# Patient Record
Sex: Female | Born: 1980 | Race: Black or African American | Hispanic: No | Marital: Single | Smoking: Current some day smoker
Health system: Southern US, Community
[De-identification: ages and names within clinical notes are randomized; demographics above are authoritative.]

## PROBLEM LIST (undated history)

## (undated) ENCOUNTER — Inpatient Hospital Stay (HOSPITAL_COMMUNITY): Payer: Self-pay

## (undated) DIAGNOSIS — F329 Major depressive disorder, single episode, unspecified: Secondary | ICD-10-CM

## (undated) DIAGNOSIS — IMO0002 Reserved for concepts with insufficient information to code with codable children: Secondary | ICD-10-CM

## (undated) DIAGNOSIS — R87619 Unspecified abnormal cytological findings in specimens from cervix uteri: Secondary | ICD-10-CM

## (undated) DIAGNOSIS — N390 Urinary tract infection, site not specified: Secondary | ICD-10-CM

## (undated) DIAGNOSIS — A749 Chlamydial infection, unspecified: Secondary | ICD-10-CM

## (undated) DIAGNOSIS — A879 Viral meningitis, unspecified: Secondary | ICD-10-CM

## (undated) DIAGNOSIS — F32A Depression, unspecified: Secondary | ICD-10-CM

## (undated) DIAGNOSIS — I10 Essential (primary) hypertension: Secondary | ICD-10-CM

## (undated) DIAGNOSIS — L509 Urticaria, unspecified: Secondary | ICD-10-CM

## (undated) DIAGNOSIS — O139 Gestational [pregnancy-induced] hypertension without significant proteinuria, unspecified trimester: Secondary | ICD-10-CM

## (undated) DIAGNOSIS — B999 Unspecified infectious disease: Secondary | ICD-10-CM

## (undated) DIAGNOSIS — R51 Headache: Secondary | ICD-10-CM

## (undated) HISTORY — PX: OTHER SURGICAL HISTORY: SHX169

## (undated) HISTORY — DX: Urticaria, unspecified: L50.9

---

## 2002-12-02 ENCOUNTER — Emergency Department (HOSPITAL_COMMUNITY): Admission: EM | Admit: 2002-12-02 | Discharge: 2002-12-02 | Payer: Self-pay | Admitting: Emergency Medicine

## 2004-09-06 DIAGNOSIS — IMO0002 Reserved for concepts with insufficient information to code with codable children: Secondary | ICD-10-CM

## 2004-09-06 HISTORY — DX: Reserved for concepts with insufficient information to code with codable children: IMO0002

## 2011-06-04 ENCOUNTER — Encounter (HOSPITAL_COMMUNITY): Payer: Self-pay | Admitting: *Deleted

## 2011-06-04 ENCOUNTER — Inpatient Hospital Stay (HOSPITAL_COMMUNITY): Payer: Medicaid Other

## 2011-06-04 ENCOUNTER — Inpatient Hospital Stay (HOSPITAL_COMMUNITY)
Admission: AD | Admit: 2011-06-04 | Discharge: 2011-06-04 | Disposition: A | Discharge status: Home / Self Care | Payer: Medicaid Other | Source: Ambulatory Visit | Attending: Obstetrics & Gynecology | Admitting: Obstetrics & Gynecology

## 2011-06-04 DIAGNOSIS — O021 Missed abortion: Secondary | ICD-10-CM | POA: Insufficient documentation

## 2011-06-04 HISTORY — DX: Major depressive disorder, single episode, unspecified: F32.9

## 2011-06-04 HISTORY — DX: Depression, unspecified: F32.A

## 2011-06-04 HISTORY — DX: Chlamydial infection, unspecified: A74.9

## 2011-06-04 LAB — WET PREP, GENITAL: Yeast Wet Prep HPF POC: NONE SEEN

## 2011-06-04 LAB — URINALYSIS, ROUTINE W REFLEX MICROSCOPIC
Bilirubin Urine: NEGATIVE
Glucose, UA: NEGATIVE mg/dL
Ketones, ur: NEGATIVE mg/dL
Protein, ur: NEGATIVE mg/dL

## 2011-06-04 LAB — CBC
MCV: 89 fL (ref 78.0–100.0)
Platelets: 258 10*3/uL (ref 150–400)
RDW: 12.9 % (ref 11.5–15.5)
WBC: 6.9 10*3/uL (ref 4.0–10.5)

## 2011-06-04 LAB — HCG, QUANTITATIVE, PREGNANCY: hCG, Beta Chain, Quant, S: 25428 m[IU]/mL — ABNORMAL HIGH (ref <5)

## 2011-06-04 LAB — URINE MICROSCOPIC-ADD ON

## 2011-06-04 NOTE — Progress Notes (Signed)
Did home test last THurs.  Started bleeding 2 hrs ago, has had yellowish d/c past 2 days- prior to bleeding had turned a dk reddish orange.

## 2011-06-04 NOTE — ED Provider Notes (Signed)
History   Chief Complaint:  Vaginal Bleeding   Karina [name] is  30 y.o. [id number] Patient's last menstrual period was 04/10/2011.Marland Kitchen  Her pregnancy status is positive.  She is [redacted]w[redacted]d by LMP.  She presents complaining of Vaginal Bleeding . Onset is described as insidious and has been present for  2 days.   OB History    Grav Para Term Preterm Abortions TAB SAB Ect Mult Living   14 6 5 1 7 4 3  0 0 5       Past Medical History  Diagnosis Date  . Preterm labor   . Depression   . Chlamydia     Past Surgical History  Procedure Date  . Elective abortion     No family history on file.  History  Substance Use Topics  . Smoking status: Never Smoker   . Smokeless tobacco: Never Used  . Alcohol Use: No    Allergies: No Known Allergies  No prescriptions prior to admission    Review of Systems - Negative except vag spotting and abd cramping  Physical Exam   Blood pressure 127/86, pulse 62, temperature 98.6 F (37 C), temperature source Oral, resp. rate 18, height 5\' 7"  (1.702 m), weight 176 lb 3.2 oz (79.924 kg), last menstrual period 04/10/2011.  General: General appearance - alert, well appearing, and in no distress, oriented to person, place, and time and overweight Mental status - alert, oriented to person, place, and time, normal mood, behavior, speech, dress, motor activity, and thought processes Abdomen - soft, nontender, nondistended, no masses or organomegaly Back exam - full range of motion, no tenderness, palpable spasm or pain on motion Neurological - alert, oriented, normal speech, no focal findings or movement disorder noted Musculoskeletal - no joint tenderness, deformity or swelling Focused Gynecological Exam: normal external genitalia, vulva, vagina, cervix, uterus and adnexa, VULVA: normal appearing vulva with no masses, tenderness or lesions, VAGINA: vaginal discharge - pink/brown in color, CERVIX: normal appearing cervix without discharge or lesions,  UTERUS:slightly enlarge, 6 weeks size, < dates, ADNEXA: normal adnexa in size, nontender and no masses  Labs: Recent Results (from the past 24 hour(s))  POCT PREGNANCY, URINE   Collection Time   06/04/11  1:47 PM      Component Value Range   Preg Test, Ur POSITIVE    URINALYSIS, ROUTINE W REFLEX MICROSCOPIC   Collection Time   06/04/11  1:55 PM      Component Value Range   Color, Urine STRAW (*) YELLOW    Appearance CLEAR  CLEAR    Specific Gravity, Urine 1.010  1.005 - 1.030    pH 7.0  5.0 - 8.0    Glucose, UA NEGATIVE  NEGATIVE (mg/dL)   Hgb urine dipstick MODERATE (*) NEGATIVE    Bilirubin Urine NEGATIVE  NEGATIVE    Ketones, ur NEGATIVE  NEGATIVE (mg/dL)   Protein, ur NEGATIVE  NEGATIVE (mg/dL)   Urobilinogen, UA 0.2  0.0 - 1.0 (mg/dL)   Nitrite NEGATIVE  NEGATIVE    Leukocytes, UA NEGATIVE  NEGATIVE   URINE MICROSCOPIC-ADD ON   Collection Time   06/04/11  1:55 PM      Component Value Range   Squamous Epithelial / LPF FEW (*) RARE    Bacteria, UA RARE  RARE   WET PREP, GENITAL   Collection Time   06/04/11  2:09 PM      Component Value Range   Yeast, Wet Prep NONE SEEN  NONE SEEN    Trich, Wet  Prep NONE SEEN  NONE SEEN    Clue Cells, Wet Prep FEW (*) NONE SEEN    WBC, Wet Prep HPF POC FEW (*) NONE SEEN   HCG, QUANTITATIVE, PREGNANCY   Collection Time   06/04/11  2:20 PM      Component Value Range   hCG, Beta Chain, Sharene Butters, [place] [address] (*) <5 (mIU/mL)  CBC   Collection Time   06/04/11  2:20 PM      Component Value Range   WBC 6.9  4.0 - 10.5 (K/uL)   RBC 3.83 (*) 3.87 - 5.11 (MIL/uL)   Hemoglobin 11.5 (*) 12.0 - 15.0 (g/dL)   HCT 60.4 (*) 54.0 - 46.0 (%)   MCV 89.0  78.0 - 100.0 (fL)   MCH 30.0  26.0 - 34.0 (pg)   MCHC 33.7  30.0 - 36.0 (g/dL)   RDW 98.1  19.1 - 47.8 (%)   Platelets 258  150 - 400 (K/uL)  ABO/RH   Collection Time   06/04/11  2:20 PM      Component Value Range   ABO/RH(D) B POS      Ultrasound Studies: US Ob Comp Less 14 Wks  Irregluar  intrauterine gest sac but no yolk sac or definite fetal pole id. Echoes w/i the gest sac are of unknown significances, possibly resorbing hemorrhage, fetal pole if there has benn a fail IUP. Or chorionic bump. Rec FU [place] in 10 days. Christiana Pellant, MD   Assessment: Probable missed AB   Plan: FU 2 days for repeat quant, if falling, no need for repeat US  Discharge Medications: None  Consult: Discussed pt with Dr. Debroah Loop, agrees with plan    [facility] E. 06/04/2011, 7:10 PM

## 2011-06-05 LAB — GC/CHLAMYDIA PROBE AMP, GENITAL: GC Probe Amp, Genital: NEGATIVE

## 2011-06-06 ENCOUNTER — Encounter (HOSPITAL_COMMUNITY): Payer: Self-pay | Admitting: *Deleted

## 2011-06-06 ENCOUNTER — Inpatient Hospital Stay (HOSPITAL_COMMUNITY)
Admission: AD | Admit: 2011-06-06 | Discharge: 2011-06-06 | Disposition: A | Discharge status: Home / Self Care | Payer: Medicaid Other | Source: Ambulatory Visit | Attending: Obstetrics and Gynecology | Admitting: Obstetrics and Gynecology

## 2011-06-06 ENCOUNTER — Ambulatory Visit (HOSPITAL_COMMUNITY): Payer: Medicaid Other

## 2011-06-06 DIAGNOSIS — O200 Threatened abortion: Secondary | ICD-10-CM | POA: Insufficient documentation

## 2011-06-06 NOTE — Progress Notes (Signed)
Pt is a repeat BHCG- put in room 2 for MD to speak with her regarding plan of care.

## 2011-06-06 NOTE — Discharge Instructions (Signed)
Threatened Miscarriage Bleeding during the first 20 weeks of pregnancy is common. This is sometimes called a threatened miscarriage. This is a pregnancy that is threatening to end before the twentieth week of pregnancy. Often this bleeding stops with bed rest or decreased activities as suggested by your caregiver and the pregnancy continues without any more problems. You may be asked to not have sexual intercourse, have orgasms or use tampons until further notice. Sometimes a threatened miscarriage can progress to a complete or incomplete miscarriage. This may or may not require further treatment. Some miscarriages occur before a woman misses a menstrual period and knows she is pregnant. Miscarriages occur in 15 to 20% of all pregnancies and usually occur during the first 13 weeks of the pregnancy. The exact cause of a miscarriage is usually never known. A miscarriage is natures way of ending a pregnancy that is abnormal or would not make it to term. There are some things that may put you at risk to have a miscarriage, such as:  Hormone problems.   Infection of the uterus or cervix.   Chronic illness, diabetes for example, especially if it is not controlled.   Abnormal shaped uterus.   Fibroids in the uterus.   Incompetent cervix (the cervix is too weak to hold the baby).   Smoking.   Drinking too much alcohol. Its best not to drink any alcohol when you are pregnant.   Taking illegal drugs.  TREATMENT When a miscarriage becomes complete and all products of conception (all the tissue in the uterus) have been passed, often no treatment is needed. If you think you passed tissue, save it in a container and take it to your doctor for evaluation. If the miscarriage is incomplete (parts of the fetus or placenta remain in the uterus), further treatment may be needed. The most common reason for further treatment is continued bleeding (hemorrhage) because pregnancy tissue did not pass out of the uterus.  This often occurs if a miscarriage is incomplete. Tissue left behind may also become infected. Treatment usually is dilatation and curettage (the removal of the remaining products of pregnancy. This can be done by a simple sucking procedure (suction curettage) or a simple scraping of the inside of the uterus. This may be done in the hospital or in the caregiver's office. This is only done when your caregiver knows that there is no chance for the pregnancy to proceed to term. This is determined by physical examination, negative pregnancy test, falling pregnancy hormone count and/or, an ultrasound revealing a dead fetus. Miscarriages are often a very emotional time for prospective mothers and fathers. This is not you or your partners fault. It did not occur because of an inadequacy in you or your partner. Nearly all miscarriages occur because the pregnancy has started off wrongly. At least half of these pregnancies have a chromosomal abnormality. It is almost always not inherited. Others may have developmental problems with the fetus or placenta. This does not always show up even when the products miscarried are studied under the microscope. The miscarriage is nearly always not your fault and it is not likely that you could have prevented it from happening. If you are having emotional and grieving problems, talk to your health care provider and even seek counseling, if necessary, before getting pregnant again. You can begin trying for another pregnancy as soon as your caregiver says it is OK. HOME CARE INSTRUCTIONS  Your caregiver may order bed rest depending on how much bleeding and cramping  you are having. You may be limited to only getting up to go to the bathroom. You may be allowed to continue light activity. You may need to make arrangements for the care of your other children and for any other responsibilities.   Keep track of the number of pads you use each day, how often you have to change pads and how  saturated (soaked) they are. Record this information.   DO NOT USE TAMPONS. Do not douche, have sexual intercourse or orgasms until approved by your caregiver.   You may receive a follow up appointment for re-evaluation of your pregnancy and a repeat blood test. Re-evaluation often occurs after 2 days and again in 4 to 6 weeks. It is very important that you follow-up in the recommended time period.   If you are Rh negative and the father is Rh positive or you do not know the fathers blood type, you may receive a shot (Rh immune globulin) to help prevent abnormal antibodies that can develop and affect the baby in any future pregnancies.  SEEK IMMEDIATE MEDICAL ATTENTION IF:  You have severe cramps in your stomach, back, or abdomen.   You have a sudden onset of severe pain in the lower part of your abdomen.   You develop chills.   You run an unexplained temperature of 101 F (38.3 C) or higher.   You pass large clots or tissue. Save any tissue for your caregiver to inspect.   Your bleeding increases or you become light-headed, weak, or have fainting episodes.   You have a gush of fluid from your vagina.   You pass out. This could mean you have a tubal (ectopic) pregnancy.  Document Released: 10/29/2005 Document Re-Released: 08/07/2008 [facility] Patient Information 2011 [place], [place].

## 2011-06-06 NOTE — Progress Notes (Signed)
Pt to MAU for repeat BHCG. States she has been having R lower abdominal pain.

## 2011-06-06 NOTE — ED Provider Notes (Addendum)
Karina [name] is a 30 y.o. female who returns for follow up Bhcg. On her last visit 7/23 her Bhcg was 25,428 and  Her ultrasound showed an irregular IUGS but no YS. Today the Bhcg has dropped slightly to 24,922. The patient states that she is having only brown discharge and no pain, light headiness or other problems. I discussed the lab results with Dr. Jolayne Panther and she wants the patient to return next week for Bhcg follow up. If the patient has heavy bleeding, severe pain or other problems she will return sooner.  [place], [place] 06/16/11 1728

## 2011-06-13 ENCOUNTER — Inpatient Hospital Stay (HOSPITAL_COMMUNITY)
Admission: RE | Admit: 2011-06-13 | Discharge: 2011-06-13 | Disposition: A | Discharge status: Home / Self Care | Payer: Medicaid Other | Source: Ambulatory Visit | Attending: Obstetrics & Gynecology | Admitting: Obstetrics & Gynecology

## 2011-06-13 DIAGNOSIS — O021 Missed abortion: Secondary | ICD-10-CM | POA: Insufficient documentation

## 2011-06-13 MED ORDER — MISOPROSTOL 200 MCG PO TABS: ORAL_TABLET | ORAL | Status: DC

## 2011-06-13 MED ORDER — PROMETHAZINE HCL 25 MG PO TABS: 25.0000 mg | ORAL_TABLET | Freq: Four times a day (QID) | ORAL | Status: DC | PRN

## 2011-06-13 MED ORDER — HYDROCODONE-ACETAMINOPHEN 5-325 MG PO TABS: 2.0000 | ORAL_TABLET | ORAL | Status: AC | PRN

## 2011-06-13 NOTE — ED Provider Notes (Signed)
History   Chief Complaint:  Follow-up   Karina [name] is  30 y.o. [id number] Patient's last menstrual period was 04/10/2011.Marland Kitchen Patient is here for follow up of quantitative HCG and ongoing surveillance of pregnancy status.   She is Unknown weeks gestation  by LMP OF 04-10-11.  Her ultrasound on 06-04-11 showed an irreg gestational sac with no yolk sac and no fetal pole.  She has no pain today and only spotting.    Since her last visit, the patient is without new complaint.   The patient reports bleeding as  spotting and lighter than period.    General ROS:  negative  Her previous Quantitative HCG values are:  25,428 on 06-04-11 and 24,922 on 06-06-11.     Physical Exam   Blood pressure 128/78, pulse 82, temperature 98.8 F (37.1 C), temperature source Oral, resp. rate 16, last menstrual period 04/10/2011.  Focused Gynecological Exam: examination not indicated  Labs: Recent Results (from the past 24 hour(s))  HCG, QUANTITATIVE, PREGNANCY   Collection Time   06/13/11 12:30 PM      Component Value Range   hCG, Beta Chain, Quant, S [address] (*) <5 (mIU/mL)    Ultrasound Studies:   US Ob Comp Less 14 Wks  06/04/2011  OBSTETRICAL ULTRASOUND: This exam was performed within a [facility]. The OB US report was generated in the [facility] system, and faxed to the ordering physician.   This report is also available in [employer] and in the [employer]. See [facility] Obstetric US report.   Counseling re; nonviable pregnancy done.  Written info given.  Client desires cytotec.  Consultation with Dr. Macon Large who is agreement with this plan.  Assessment: Nonviable pregnancy, inevidable miscarriage.   Plan:  Cytotec, nausea and pain medication prescribed. The patient is instructed to follow up in in 2 weeks at GYN clinic.  BURLESON,TERRI 06/13/2011, 3:10 PM    Nolene Bernheim, NP 06/13/11 1517

## 2011-06-13 NOTE — Progress Notes (Signed)
Pt to MAU for  Repeat BHCG. Pt denies any pain or bleeding.

## 2011-06-13 NOTE — Plan of Care (Signed)
Patient given the Cytotec Facts to Know sheet. Consented to plan of care. Consent signed.

## 2011-06-13 NOTE — ED Provider Notes (Signed)
Agree with above note.  ANYANWU,UGONNA A 06/13/2011 4:50 PM

## 2011-06-23 ENCOUNTER — Inpatient Hospital Stay (HOSPITAL_COMMUNITY)
Admission: AD | Admit: 2011-06-23 | Discharge: 2011-06-23 | Disposition: A | Discharge status: Home / Self Care | Payer: Medicaid Other | Source: Ambulatory Visit | Attending: Obstetrics & Gynecology | Admitting: Obstetrics & Gynecology

## 2011-06-23 ENCOUNTER — Encounter (HOSPITAL_COMMUNITY): Payer: Self-pay | Admitting: Obstetrics and Gynecology

## 2011-06-23 ENCOUNTER — Inpatient Hospital Stay (HOSPITAL_COMMUNITY): Payer: Medicaid Other

## 2011-06-23 DIAGNOSIS — O039 Complete or unspecified spontaneous abortion without complication: Secondary | ICD-10-CM

## 2011-06-23 DIAGNOSIS — O200 Threatened abortion: Secondary | ICD-10-CM | POA: Insufficient documentation

## 2011-06-23 LAB — CBC
Hemoglobin: 11.8 g/dL — ABNORMAL LOW (ref 12.0–15.0)
MCH: 30.1 pg (ref 26.0–34.0)
MCV: 88.5 fL (ref 78.0–100.0)
Platelets: 315 10*3/uL (ref 150–400)
RBC: 3.92 MIL/uL (ref 3.87–5.11)

## 2011-06-23 NOTE — ED Provider Notes (Signed)
History     CSN: [id number] Arrival date & time: 06/23/2011 12:19 PM  Chief Complaint  Patient presents with  . Vaginal Bleeding  . Miscarriage   HPI Karina [name] is a 30 y.o. AA female who presents to MAU with heavy vaginal bleeding that started at 3am. She was evaluated here a few weeks ago for failed pregnancy. She was given Rx for cytotec but did not get it filled. She has an appointment with the GYN clinic on September 5th. Today in addition to the bleeding she is having lower abdominal cramping. The patient provided the history.    Past Medical History  Diagnosis Date  . Preterm labor   . Depression   . Chlamydia     Past Surgical History  Procedure Date  . Elective abortion     No family history on file.  History  Substance Use Topics  . Smoking status: Never Smoker   . Smokeless tobacco: Never Used  . Alcohol Use: No    OB History    Grav Para Term Preterm Abortions TAB SAB Ect Mult Living   16 6 5 1 7 4 3  0 0 5      Review of Systems  Respiratory: Negative.   Cardiovascular: Negative.   Gastrointestinal: Positive for abdominal pain. Negative for nausea, vomiting, diarrhea and constipation.  Genitourinary: Positive for vaginal bleeding and pelvic pain.  Musculoskeletal: Positive for back pain.  Skin: Negative.   Neurological: Negative for dizziness, light-headedness and headaches.    Physical Exam  LMP 04/10/2011  Physical Exam  Nursing note and vitals reviewed. Constitutional: She is oriented to person, place, and time. She appears well-developed and well-nourished.  Eyes: EOM are normal.  Neck: Neck supple.  Pulmonary/Chest: Effort normal.  Abdominal: Soft. There is no tenderness.  Genitourinary:       Moderate blood in vaginal vault. Cervix open with clot and ? Tissue. Removed with ring forceps.  Musculoskeletal: Normal range of motion.  Neurological: She is alert and oriented to person, place, and time. No cranial nerve deficit.  Skin:  Skin is warm and dry.   Results for orders placed during the hospital encounter of 06/23/11 (from the past 24 hour(s))  CBC     Status: Abnormal   Collection Time   06/23/11  1:19 PM      Component Value Range   WBC 6.6  4.0 - 10.5 (K/uL)   RBC 3.92  3.87 - 5.11 (MIL/uL)   Hemoglobin 11.8 (*) 12.0 - 15.0 (g/dL)   HCT 40.9 (*) 81.1 - 46.0 (%)   MCV 88.5  78.0 - 100.0 (fL)   MCH 30.1  26.0 - 34.0 (pg)   MCHC 34.0  30.0 - 36.0 (g/dL)   RDW 91.4  78.2 - 95.6 (%)   Platelets 315  150 - 400 (K/uL)  HCG, QUANTITATIVE, PREGNANCY     Status: Abnormal   Collection Time   06/23/11  1:19 PM      Component Value Range   hCG, Beta Chain, [place], S [address] (*) <5 (mIU/mL)    ED Course    Procedures  US OB Transvaginal   Status: Final result     Study Result     *RADIOLOGY REPORT*   Clinical Data: Spontaneous abortion on 06/04/2011, bleeding, evaluate for RPOC   TRANSVAGINAL OB ULTRASOUND   Technique:  Transvaginal ultrasound was performed for evaluation of the gestation as well as the maternal uterus and adnexal regions.   Comparison: 06/04/2011   Findings:  Heterogeneous appearance of the endometrial complex, measuring up to 12 mm, with fluid/debris in the endometrial cavity. No gestational sac or focal endometrial flow is seen.   Left ovary is within normal limits, measuring 1.7 x 2.5 x to 0.0 cm. Right ovary is within normal limits, measuring 1.6 x 2.0 x 2.8 cm.   No free fluid.   IMPRESSION: Heterogeneous appearance of the endometrial complex, measuring up to 12 mm, without focal vascularity.  Given the appearance and positive beta HCG, these findings are worrisome for nonvascular retained products of conception.   Original Report Authenticated By: Charline Bills, M.D.     MDM Bleeding has decreased and minimal cramping.   Consult with Dr. Macon Large, will d/c patient home and have her keep her appointment with GYN clinic 07/18/11  Assessment: SAB in progress  Plan:    Ibuprofen as needed and keep appointment in clinic            Return here as needed.      [place], [place] 06/23/11 [phone]

## 2011-06-23 NOTE — Progress Notes (Signed)
Pt presents to MAU with complaints of vaginal bleeding. Pt was seen here on 8/1 or follow up quant. Pt was given cytotec was unable to fill. Was told by MAU provider that spont. miscarriage would occur and to come back if bleeding and cramping worsen. Pt complains of vag bleeding, soaking 2 pads/ hr.

## 2011-06-27 NOTE — ED Provider Notes (Signed)
Agree with above note.  Karina [name] A 06/27/2011 9:18 AM

## 2011-07-18 ENCOUNTER — Emergency Department (HOSPITAL_COMMUNITY): Payer: Medicaid Other

## 2011-07-18 ENCOUNTER — Emergency Department (HOSPITAL_COMMUNITY)
Admission: EM | Admit: 2011-07-18 | Discharge: 2011-07-19 | Disposition: A | Discharge status: Home / Self Care | Payer: Medicaid Other | Attending: Emergency Medicine | Admitting: Emergency Medicine

## 2011-07-18 ENCOUNTER — Encounter: Payer: Medicaid Other | Admitting: Obstetrics and Gynecology

## 2011-07-18 DIAGNOSIS — R0602 Shortness of breath: Secondary | ICD-10-CM | POA: Insufficient documentation

## 2011-07-18 DIAGNOSIS — F410 Panic disorder [episodic paroxysmal anxiety] without agoraphobia: Secondary | ICD-10-CM | POA: Insufficient documentation

## 2011-07-18 DIAGNOSIS — F438 Other reactions to severe stress: Secondary | ICD-10-CM | POA: Insufficient documentation

## 2011-07-18 DIAGNOSIS — F4389 Other reactions to severe stress: Secondary | ICD-10-CM | POA: Insufficient documentation

## 2011-07-18 DIAGNOSIS — F17200 Nicotine dependence, unspecified, uncomplicated: Secondary | ICD-10-CM | POA: Insufficient documentation

## 2011-07-18 LAB — POCT I-STAT, CHEM 8
BUN: 11 mg/dL (ref 6–23)
Calcium, Ion: 1.14 mmol/L (ref 1.12–1.32)
Creatinine, Ser: 1.2 mg/dL — ABNORMAL HIGH (ref 0.50–1.10)
TCO2: 21 mmol/L (ref 0–100)

## 2011-09-13 NOTE — ED Provider Notes (Signed)
Agree with above note.  Indie Nickerson 09/13/2011 12:38 PM

## 2012-03-29 ENCOUNTER — Encounter (HOSPITAL_COMMUNITY): Payer: Self-pay

## 2012-03-29 ENCOUNTER — Inpatient Hospital Stay (HOSPITAL_COMMUNITY): Payer: Medicaid Other

## 2012-03-29 ENCOUNTER — Inpatient Hospital Stay (HOSPITAL_COMMUNITY)
Admission: AD | Admit: 2012-03-29 | Discharge: 2012-03-29 | Disposition: A | Discharge status: Home / Self Care | Payer: Medicaid Other | Source: Ambulatory Visit | Attending: Obstetrics & Gynecology | Admitting: Obstetrics & Gynecology

## 2012-03-29 DIAGNOSIS — O2650 Maternal hypotension syndrome, unspecified trimester: Secondary | ICD-10-CM | POA: Insufficient documentation

## 2012-03-29 DIAGNOSIS — N760 Acute vaginitis: Secondary | ICD-10-CM

## 2012-03-29 DIAGNOSIS — O99891 Other specified diseases and conditions complicating pregnancy: Secondary | ICD-10-CM | POA: Insufficient documentation

## 2012-03-29 DIAGNOSIS — O210 Mild hyperemesis gravidarum: Secondary | ICD-10-CM | POA: Insufficient documentation

## 2012-03-29 DIAGNOSIS — R1031 Right lower quadrant pain: Secondary | ICD-10-CM | POA: Insufficient documentation

## 2012-03-29 DIAGNOSIS — O219 Vomiting of pregnancy, unspecified: Secondary | ICD-10-CM

## 2012-03-29 LAB — CBC
HCT: 35.7 % — ABNORMAL LOW (ref 36.0–46.0)
Hemoglobin: 12.3 g/dL (ref 12.0–15.0)
MCHC: 34.5 g/dL (ref 30.0–36.0)
RBC: 4.13 MIL/uL (ref 3.87–5.11)

## 2012-03-29 LAB — URINALYSIS, ROUTINE W REFLEX MICROSCOPIC
Glucose, UA: NEGATIVE mg/dL
Hgb urine dipstick: NEGATIVE
Ketones, ur: 40 mg/dL — AB
Protein, ur: 30 mg/dL — AB

## 2012-03-29 LAB — WET PREP, GENITAL: Yeast Wet Prep HPF POC: NONE SEEN

## 2012-03-29 LAB — URINE MICROSCOPIC-ADD ON

## 2012-03-29 LAB — RAPID URINE DRUG SCREEN, HOSP PERFORMED
Barbiturates: NOT DETECTED
Cocaine: NOT DETECTED
Opiates: NOT DETECTED

## 2012-03-29 LAB — POCT PREGNANCY, URINE: Preg Test, Ur: POSITIVE — AB

## 2012-03-29 MED ORDER — METOCLOPRAMIDE HCL 5 MG/ML IJ SOLN
10.0000 mg | Freq: Once | INTRAMUSCULAR | Status: AC
  Administered 2012-03-29: 10 mg via INTRAVENOUS
  Filled 2012-03-29: qty 2

## 2012-03-29 MED ORDER — METRONIDAZOLE 500 MG PO TABS: 500.0000 mg | ORAL_TABLET | Freq: Two times a day (BID) | ORAL | Status: AC

## 2012-03-29 MED ORDER — DEXTROSE 5 % IN LACTATED RINGERS IV BOLUS
1000.0000 mL | Freq: Once | INTRAVENOUS | Status: AC
  Administered 2012-03-29: 1000 mL via INTRAVENOUS

## 2012-03-29 MED ORDER — ONDANSETRON HCL 4 MG/2ML IJ SOLN
4.0000 mg | Freq: Once | INTRAMUSCULAR | Status: AC
  Administered 2012-03-29: 4 mg via INTRAVENOUS
  Filled 2012-03-29: qty 2

## 2012-03-29 MED ORDER — PROMETHAZINE HCL 12.5 MG PO TABS: 12.5000 mg | ORAL_TABLET | Freq: Four times a day (QID) | ORAL | Status: DC | PRN

## 2012-03-29 MED ORDER — PRENATAL VITAMINS 28-0.8 MG PO TABS: 1.0000 | ORAL_TABLET | Freq: Every day | ORAL | Status: DC

## 2012-03-29 MED ORDER — ONDANSETRON HCL 4 MG PO TABS: 4.0000 mg | ORAL_TABLET | Freq: Three times a day (TID) | ORAL | Status: AC | PRN

## 2012-03-29 NOTE — ED Notes (Signed)
Pt found to be unresposive to sternal rub. Amonia ihalent and pt came to. V/S as listed in doc flow. Pt awake alert and oriented x4 now. Pt was hyperventilating put pt  Back on O2 and had pt take slow deep breath. Pt calmer now and talking. Attempting to start IV.

## 2012-03-29 NOTE — MAU Provider Note (Signed)
History     CSN: [id number]  Arrival date and time: 03/29/12 1755   First Provider Initiated Contact with Patient 03/29/12 1843      Chief Complaint  Patient presents with  . Emesis During Pregnancy  . Fatigue  . Abdominal Pain    right side    HPI Pt is [redacted]w[redacted]d pregnant and complains of RLQ pain and nausea and vomiting since yesterday.  Pt denies spotting or bleeding. Pt had syncopal episode upon arrival in MAU.  She reports the last time she ate was 2 days ago and she reports problems prior to pregnancy "feeling like I want to pass out" when she hasn't eaten.   Past Medical History  Diagnosis Date  . Preterm labor   . Depression   . Chlamydia     Past Surgical History  Procedure Date  . Elective abortion     History reviewed. No pertinent family history.  History  Substance Use Topics  . Smoking status: Never Smoker   . Smokeless tobacco: Never Used  . Alcohol Use: No    Allergies: No Known Allergies  No prescriptions prior to admission    Review of Systems  Constitutional: Negative for fever, chills and malaise/fatigue.  Eyes: Negative for blurred vision.  Respiratory: Negative for cough and shortness of breath.   Cardiovascular: Negative for chest pain.  Gastrointestinal: Positive for nausea, vomiting and abdominal pain. Negative for heartburn.  Genitourinary: Negative for dysuria, urgency and frequency.  Musculoskeletal: Negative.   Neurological: Negative for dizziness and headaches.  Psychiatric/Behavioral: Negative for depression.   Physical Exam   Blood pressure 155/99, pulse 52, temperature 98 F (36.7 C), temperature source Oral, resp. rate 18, last menstrual period 02/14/2012, unknown if currently breastfeeding.  Physical Exam  Constitutional: She appears well-developed and well-nourished.  HENT:  Head: Normocephalic.  Eyes: Pupils are equal, round, and reactive to light.  Neck: Normal range of motion. Neck supple.  Cardiovascular: Normal  rate.   Respiratory: Effort normal.  GI: Soft. She exhibits no distension and no mass. There is no tenderness. There is no rebound and no guarding.  Genitourinary:       Mod amount of frothy white discharge in vault ;cervix parous clean; uterus 6-8 weeks size nontender; right adnexal tenderness; no rebound  Musculoskeletal: Normal range of motion.  Neurological: She is alert.  Skin: Skin is warm and dry.    MAU Course  Procedures Results for orders placed during the hospital encounter of 03/29/12 (from the past 24 hour(s))  URINALYSIS, ROUTINE W REFLEX MICROSCOPIC     Status: Abnormal   Collection Time   03/29/12  6:15 PM      Component Value Range   Color, Urine YELLOW  YELLOW    APPearance CLEAR  CLEAR    Specific Gravity, Urine 1.020  1.005 - 1.030    pH 7.0  5.0 - 8.0    Glucose, UA NEGATIVE  NEGATIVE (mg/dL)   Hgb urine dipstick NEGATIVE  NEGATIVE    Bilirubin Urine NEGATIVE  NEGATIVE    Ketones, ur 40 (*) NEGATIVE (mg/dL)   Protein, ur 30 (*) NEGATIVE (mg/dL)   Urobilinogen, UA 0.2  0.0 - 1.0 (mg/dL)   Nitrite NEGATIVE  NEGATIVE    Leukocytes, UA NEGATIVE  NEGATIVE   POCT PREGNANCY, URINE     Status: Abnormal   Collection Time   03/29/12  6:15 PM      Component Value Range   Preg Test, Ur POSITIVE (*) NEGATIVE  URINE MICROSCOPIC-ADD ON     Status: Abnormal   Collection Time   03/29/12  6:15 PM      Component Value Range   Squamous Epithelial / LPF FEW (*) RARE    WBC, UA 0-2  <3 (WBC/hpf)   RBC / HPF 0-2  <3 (RBC/hpf)   Bacteria, UA FEW (*) RARE    Urine-Other MUCOUS PRESENT    CBC     Status: Abnormal   Collection Time   03/29/12  7:03 PM      Component Value Range   WBC 7.3  4.0 - 10.5 (K/uL)   RBC 4.13  3.87 - 5.11 (MIL/uL)   Hemoglobin 12.3  12.0 - 15.0 (g/dL)   HCT 16.1 (*) 09.6 - 46.0 (%)   MCV 86.4  78.0 - 100.0 (fL)   MCH 29.8  26.0 - 34.0 (pg)   MCHC 34.5  30.0 - 36.0 (g/dL)   RDW 04.5  40.9 - 81.1 (%)   Platelets 261  150 - 400 (K/uL)  HCG,  QUANTITATIVE, PREGNANCY     Status: Abnormal   Collection Time   03/29/12  7:03 PM      Component Value Range   hCG, Beta Chain, Quant, S [address] (*) <5 (mIU/mL)    Care handed over to [facility], CNM  LINEBERRY,SUSAN 03/29/2012, 8:16 PM   US Ob Comp Less 14 Wks  03/29/2012  *RADIOLOGY REPORT*  Clinical Data: Pregnant.  Pain  OBSTETRIC <14 WK ULTRASOUND  Technique:  Transabdominal ultrasound was performed for evaluation of the gestation as well as the maternal uterus and adnexal regions.  Comparison:  None.  Intrauterine gestational sac: Present Yolk sac: Present Embryo: Present Cardiac Activity: yes Heart Rate: 103 bpm  MSD:  mm  w  d CRL:  2.8 mm  6w  0d          [place] EDC: 11/22/2012  Maternal uterus/Adnexae: Corpus luteum on the left.  No free fluid  IMPRESSION: Single intrauterine pregnancy 6-week-0-day.  Original Report Authenticated By: Camelia Phenes, M.D.   Assessment and Plan  IUP [redacted]w[redacted]d by U/S N/V of pregnancy  D/C home Zofran 4 mg PO Q8 h Phenergan 12.5 mg PO Q6 h Prenatal vitamins Discussed importance of PO fluids and regular meals List of prenatal providers given F/U with early prenatal care Return to MAU as needed  LEFTWICH-KIRBY, Kalani Sthilaire

## 2012-03-29 NOTE — Discharge Instructions (Signed)
Morning Sickness Morning sickness is when you feel sick to your stomach (nauseous) during pregnancy. This nauseous feeling may or may not come with throwing up (vomiting). It often occurs in the morning, but can be a problem any time of day. While morning sickness is unpleasant, it is usually harmless unless you develop severe and continual vomiting (hyperemesis gravidarum). This condition requires more intense treatment. CAUSES  The cause of morning sickness is not completely known but seems to be related to a sudden increase of two hormones:   Human chorionic gonadotropin (hCG).   Estrogen hormone.  These are elevated in the first part of the pregnancy. TREATMENT  Do not use any medicines (prescription, over-the-counter, or herbal) for morning sickness without first talking to your caregiver. Some patients are helped by the following:  Vitamin B6 (25mg every 8 hours) or vitamin B6 shots.   An antihistamine called doxylamine (10mg every 8 hours).   The herbal medication ginger.  HOME CARE INSTRUCTIONS   Taking multivitamins before getting pregnant can prevent or decrease the severity of morning sickness in most women.   Eat a piece of dry toast or unsalted crackers before getting out of bed in the morning.   Eat 5 or 6 small meals a day.   Eat dry and bland foods (rice, baked potato).   Do not drink liquids with your meals. Drink liquids between meals.   Avoid greasy, fatty, and spicy foods.   Get someone to cook for you if the smell of any food causes nausea and vomiting.   Avoid vitamin pills with iron because iron can cause nausea.   Snack on protein foods between meals if you are hungry.   Eat unsweetened gelatins for deserts.   Wear an acupressure wristband (worn for sea sickness) may be helpful.   Acupuncture may be helpful.   Do not smoke.   Get a humidifier to keep the air in your house free of odors.  SEEK MEDICAL CARE IF:   Your home remedies are not working  and you need medication.   You feel dizzy or lightheaded.   You are losing weight.   You need help with your diet.  SEEK IMMEDIATE MEDICAL CARE IF:   You have persistent and uncontrolled nausea and vomiting.   You pass out (faint).   You have a fever.  MAKE SURE YOU:   Understand these instructions.   Will watch your condition.   Will get help right away if you are not doing well or get worse.  Document Released: 12/20/2006 Document Revised: 10/18/2011 Document Reviewed: 10/17/2007 Exit[employer] Patient Information 2012 [employer].Bacterial Vaginosis Bacterial vaginosis (BV) is a vaginal infection where the normal balance of bacteria in the vagina is disrupted. The normal balance is then replaced by an overgrowth of certain bacteria. There are several different kinds of bacteria that can cause BV. BV is the most common vaginal infection in women of childbearing age. CAUSES   The cause of BV is not fully understood. BV develops when there is an increase or imbalance of harmful bacteria.   Some activities or behaviors can upset the normal balance of bacteria in the vagina and put women at increased risk including:   Having a new sex partner or multiple sex partners.   Douching.   Using an intrauterine device (IUD) for contraception.   It is not clear what role sexual activity plays in the development of BV. However, women that have never had sexual intercourse are rarely infected with   BV.  Women do not get BV from toilet seats, bedding, swimming pools or from touching objects around them.  SYMPTOMS   Grey vaginal discharge.   A fish-like odor with discharge, especially after sexual intercourse.   Itching or burning of the vagina and vulva.   Burning or pain with urination.   Some women have no signs or symptoms at all.  DIAGNOSIS  Your caregiver must examine the vagina for signs of BV. Your caregiver will perform lab tests and look at the sample of vaginal fluid  through a microscope. They will look for bacteria and abnormal cells (clue cells), a pH test higher than 4.5, and a positive amine test all associated with BV.  RISKS AND COMPLICATIONS   Pelvic inflammatory disease (PID).   Infections following gynecology surgery.   Developing HIV.   Developing herpes virus.  TREATMENT  Sometimes BV will clear up without treatment. However, all women with symptoms of BV should be treated to avoid complications, especially if gynecology surgery is planned. Female partners generally do not need to be treated. However, BV may spread between female sex partners so treatment is helpful in preventing a recurrence of BV.   BV may be treated with antibiotics. The antibiotics come in either pill or vaginal cream forms. Either can be used with nonpregnant or pregnant women, but the recommended dosages differ. These antibiotics are not harmful to the baby.   BV can recur after treatment. If this happens, a second round of antibiotics will often be prescribed.   Treatment is important for pregnant women. If not treated, BV can cause a premature delivery, especially for a pregnant woman who had a premature birth in the past. All pregnant women who have symptoms of BV should be checked and treated.   For chronic reoccurrence of BV, treatment with a type of prescribed gel vaginally twice a week is helpful.  HOME CARE INSTRUCTIONS   Finish all medication as directed by your caregiver.   Do not have sex until treatment is completed.   Tell your sexual partner that you have a vaginal infection. They should see their caregiver and be treated if they have problems, such as a mild rash or itching.   Practice safe sex. Use condoms. Only have 1 sex partner.  PREVENTION  Basic prevention steps can help reduce the risk of upsetting the natural balance of bacteria in the vagina and developing BV:  Do not have sexual intercourse (be abstinent).   Do not douche.   Use all of  the medicine prescribed for treatment of BV, even if the signs and symptoms go away.   Tell your sex partner if you have BV. That way, they can be treated, if needed, to prevent reoccurrence.  SEEK MEDICAL CARE IF:   Your symptoms are not improving after 3 days of treatment.   You have increased discharge, pain, or fever.  MAKE SURE YOU:   Understand these instructions.   Will watch your condition.   Will get help right away if you are not doing well or get worse.  FOR MORE INFORMATION  Division of STD Prevention (DSTDP), Centers for Disease Control and Prevention: www.cdc.gov/std American Social Health Association (ASHA): www.ashastd.org  Document Released: 10/29/2005 Document Revised: 10/18/2011 Document Reviewed: 04/21/2009 ExitCare Patient Information 2012 [employer]. 

## 2012-03-29 NOTE — Progress Notes (Signed)
Written and verbal d/c instructions given and understanding voiced. 

## 2012-03-29 NOTE — Progress Notes (Signed)
Susan Lineberry NP in to see pt 

## 2012-03-29 NOTE — MAU Note (Signed)
Onset of nausea for 2 weeks, onset of right side pain for 3 days, weakness

## 2012-03-29 NOTE — MAU Note (Signed)
Patient c/o dizziness feels like going to pass out  O2 at 10L via mask Pamelia Hoit NP into see patient.

## 2012-03-29 NOTE — MAU Note (Signed)
Pt vomited about 200cc cl fld

## 2012-03-29 NOTE — MAU Note (Signed)
Pt up to [facility] via w/c and tol well. Requested IV to be d/ced so she can leave to take care of her 4 children at home. Sharen Counter CNM aware and ok to d/c ivfs

## 2012-03-31 LAB — URINE CULTURE
Colony Count: 35000
Culture  Setup Time: 201305190146

## 2012-03-31 LAB — GC/CHLAMYDIA PROBE AMP, GENITAL
Chlamydia, DNA Probe: NEGATIVE
GC Probe Amp, Genital: NEGATIVE

## 2012-04-18 ENCOUNTER — Encounter (HOSPITAL_COMMUNITY): Payer: Self-pay | Admitting: Emergency Medicine

## 2012-04-18 ENCOUNTER — Emergency Department (HOSPITAL_COMMUNITY)
Admission: EM | Admit: 2012-04-18 | Discharge: 2012-04-18 | Disposition: A | Discharge status: Home Health | Payer: Medicaid Other | Attending: Emergency Medicine | Admitting: Emergency Medicine

## 2012-04-18 DIAGNOSIS — N72 Inflammatory disease of cervix uteri: Secondary | ICD-10-CM | POA: Insufficient documentation

## 2012-04-18 DIAGNOSIS — N390 Urinary tract infection, site not specified: Secondary | ICD-10-CM | POA: Insufficient documentation

## 2012-04-18 DIAGNOSIS — F3289 Other specified depressive episodes: Secondary | ICD-10-CM | POA: Insufficient documentation

## 2012-04-18 DIAGNOSIS — F329 Major depressive disorder, single episode, unspecified: Secondary | ICD-10-CM | POA: Insufficient documentation

## 2012-04-18 HISTORY — DX: Viral meningitis, unspecified: A87.9

## 2012-04-18 LAB — URINALYSIS, ROUTINE W REFLEX MICROSCOPIC
Glucose, UA: NEGATIVE mg/dL
Protein, ur: 30 mg/dL — AB
Specific Gravity, Urine: 1.026 (ref 1.005–1.030)
pH: 7.5 (ref 5.0–8.0)

## 2012-04-18 LAB — WET PREP, GENITAL
Trich, Wet Prep: NONE SEEN
Yeast Wet Prep HPF POC: NONE SEEN

## 2012-04-18 LAB — PREGNANCY, URINE: Preg Test, Ur: POSITIVE — AB

## 2012-04-18 LAB — URINE MICROSCOPIC-ADD ON

## 2012-04-18 MED ORDER — ONDANSETRON 4 MG PO TBDP
8.0000 mg | ORAL_TABLET | Freq: Once | ORAL | Status: AC
  Administered 2012-04-18: 8 mg via ORAL
  Filled 2012-04-18: qty 2

## 2012-04-18 MED ORDER — AZITHROMYCIN 250 MG PO TABS
1000.0000 mg | ORAL_TABLET | Freq: Once | ORAL | Status: AC
  Administered 2012-04-18: 1000 mg via ORAL
  Filled 2012-04-18: qty 4

## 2012-04-18 MED ORDER — METRONIDAZOLE 500 MG PO TABS: 500.0000 mg | ORAL_TABLET | Freq: Two times a day (BID) | ORAL | Status: DC

## 2012-04-18 MED ORDER — METRONIDAZOLE 500 MG PO TABS: 500.0000 mg | ORAL_TABLET | Freq: Three times a day (TID) | ORAL | Status: DC

## 2012-04-18 MED ORDER — CEFTRIAXONE SODIUM 250 MG IJ SOLR
250.0000 mg | Freq: Once | INTRAMUSCULAR | Status: AC
  Administered 2012-04-18: 250 mg via INTRAMUSCULAR
  Filled 2012-04-18: qty 250

## 2012-04-18 MED ORDER — NITROFURANTOIN MONOHYD MACRO 100 MG PO CAPS: 100.0000 mg | ORAL_CAPSULE | Freq: Two times a day (BID) | ORAL | Status: AC

## 2012-04-18 NOTE — ED Notes (Signed)
Pt reports exposure to partner with trich. Pt reports vaginal itching, cottage cheese like discharge, right groin pain and [redacted] weeks pregnant.

## 2012-04-18 NOTE — Discharge Instructions (Signed)
We are treating you for cervicitis today. [facility]up [facility] next week and get rechecked at the free [facility] clinic. He also need to get registered for prenatal care. Continue her prenatal vitamins. Get the Flagyl filled today and take the full dose this week.   Continue prenatal vitamins.  [facility] and get rechecked at the free std clinic.  Also get your prenatal care at the health department. Make an appointment as soon as possible. Tylenol for pain.  Her abdominal pain or vaginal bleeding. Cervicitis Cervicitis is a soreness and puffiness (inflammation) of the cervix. The cervix is at the bottom of the uterus. Your doctor will do an exam to find the cause. Your treatment will depend on the cause. If the cause is a sexual infection, you and your partner both need treatment. HOME CARE  Do not have sex (intercourse) until your doctor says it is okay.   Do not have sex until your partner is treated if your doctor told you not to.   Take medicines (antibiotics) as told. Finish them even if you start to feel better.  GET HELP RIGHT AWAY IF:   Your problems come back.   You have a fever.   You have problems that may be caused by your medicine.  MAKE SURE YOU:   Understand these instructions.   Will watch your condition.   Will get help right away if you are not doing well or get worse.  Document Released: 08/07/2008 Document Revised: 10/18/2011 Document Reviewed: 05/28/2011 [facility] Patient Information 2012 [place], [place].

## 2012-04-18 NOTE — ED Provider Notes (Signed)
History     CSN: [id number]  Arrival date & time 04/18/12  0903   First MD Initiated Contact with Patient 04/18/12 0915      No chief complaint on file.   (Consider location/radiation/quality/duration/timing/severity/associated sxs/prior treatment) Patient is a 31 y.o. female presenting with vaginal discharge. The history is provided by the patient. No language interpreter was used.  Vaginal Discharge This is a new problem. The current episode started in the past 7 days. The problem occurs every several days. The problem has been gradually worsening. Associated symptoms include nausea. Pertinent negatives include no chest pain, diaphoresis, fever, neck pain, numbness, sore throat, vomiting or weakness. She has tried nothing for the symptoms.   31 year old female here today to see if she has Trichomonas and she thinks she can start that from her boyfriend who lives out of town. States that 2 weeks ago she was diagnosed with bacterial vaginosis but did not take the medication. Patient is a [redacted] weeks pregnant and denying vaginal bleeding or abdominal pain. Patient has been having discharge for the past week. Patient does not look toxic.  Past Medical History  Diagnosis Date  . Preterm labor   . Depression   . Chlamydia     Past Surgical History  Procedure Date  . Elective abortion     No family history on file.  History  Substance Use Topics  . Smoking status: Never Smoker   . Smokeless tobacco: Never Used  . Alcohol Use: No    OB History    Grav Para Term Preterm Abortions TAB SAB Ect Mult Living   17 6 5 1 7 4 3  0 0 5      Review of Systems  Constitutional: Negative.  Negative for fever and diaphoresis.  HENT: Negative.  Negative for sore throat and neck pain.   Eyes: Negative.   Respiratory: Negative.  Negative for shortness of breath.   Cardiovascular: Negative.  Negative for chest pain.  Gastrointestinal: Positive for nausea. Negative for vomiting.  Genitourinary:  Positive for vaginal discharge and vaginal pain. Negative for dysuria, urgency, frequency, hematuria, flank pain, vaginal bleeding, difficulty urinating, pelvic pain and dyspareunia.  Neurological: Negative.  Negative for dizziness, weakness, light-headedness and numbness.  Psychiatric/Behavioral: Negative.   All other systems reviewed and are negative.    Allergies  Review of patient's allergies indicates no known allergies.  Home Medications   Current Outpatient Rx  Name Route Sig Dispense Refill  . PRENATAL VITAMINS 28-0.8 MG PO TABS Oral Take 1 tablet by mouth daily. 30 tablet 5  . PROMETHAZINE HCL 12.5 MG PO TABS Oral Take 1 tablet (12.5 mg total) by mouth every 6 (six) hours as needed for nausea. 15 tablet 0  . PROMETHAZINE HCL 25 MG PO TABS Oral Take 1 tablet (25 mg total) by mouth every 6 (six) hours as needed for nausea. 15 tablet 0    LMP 02/14/2012  Breastfeeding? Unknown  Physical Exam  Nursing note and vitals reviewed. Constitutional: She is oriented to person, place, and time. She appears well-developed and well-nourished.  HENT:  Head: Normocephalic and atraumatic.  Eyes: Conjunctivae and EOM are normal. Pupils are equal, round, and reactive to light.  Neck: Normal range of motion. Neck supple.  Cardiovascular: Normal rate.   Pulmonary/Chest: Effort normal and breath sounds normal.  Abdominal: Soft. Bowel sounds are normal. She exhibits no distension. There is no tenderness.  Genitourinary: There is erythema and tenderness around the vagina. Vaginal discharge found.  Musculoskeletal: Normal  range of motion. She exhibits no edema and no tenderness.  Neurological: She is alert and oriented to person, place, and time. She has normal reflexes.  Skin: Skin is warm and dry.  Psychiatric: She has a normal mood and affect.    ED Course  Pelvic exam Date/Time: 04/18/2012 5:06 PM Performed by: Remi Haggard Authorized by: Remi Haggard Consent: Verbal consent  obtained. Written consent not obtained. Risks and benefits: risks, benefits and alternatives were discussed Consent given by: patient Patient understanding: patient states understanding of the procedure being performed Patient identity confirmed: verbally with patient, arm band, provided demographic data and hospital-assigned identification number Time out: Immediately prior to procedure a "time out" was called to verify the correct patient, procedure, equipment, support staff and site/side marked as required. Preparation: Patient was prepped and draped in the usual sterile fashion. Local anesthesia used: no Patient sedated: no Patient tolerance: Patient tolerated the procedure well with no immediate complications.   (including critical care time)  Labs Reviewed - No data to display No results found.   No diagnosis found.    MDM  Pelvic exam reveals vaginal discharge and cervicitis. Positive clue cells. Treated in the ER was Rocephin 250 IM and azithromycin milligram and Zofran. Patient will take Flagyl for the next 5 days. [facility]up [facility] at the [facility]. This is also where she will establish her prenatal care. Continue prenatal vitamins. Return to the ER for any kind of abdominal pain or vaginal bleeding.        Remi Haggard, NP 04/18/12 1707

## 2012-04-19 LAB — GC/CHLAMYDIA PROBE AMP, GENITAL
Chlamydia, DNA Probe: NEGATIVE
GC Probe Amp, Genital: NEGATIVE

## 2012-04-19 NOTE — ED Provider Notes (Signed)
Medical screening examination/treatment/procedure(s) were conducted as a shared visit with non-physician practitioner(s) and myself.  I personally evaluated the patient during the encounter.  Chief complaint is vaginal discharge.  Patient is nontoxic with no acute abdomen. Case discussed with gynecologist on call.  We initiated her recommendation  Donnetta Hutching, MD 04/19/12 1049

## 2012-04-20 ENCOUNTER — Inpatient Hospital Stay (HOSPITAL_COMMUNITY)
Admission: AD | Admit: 2012-04-20 | Discharge: 2012-04-20 | Disposition: A | Discharge status: Hospice | Payer: Medicaid Other | Source: Ambulatory Visit | Attending: Obstetrics & Gynecology | Admitting: Obstetrics & Gynecology

## 2012-04-20 ENCOUNTER — Encounter (HOSPITAL_COMMUNITY): Payer: Self-pay | Admitting: Obstetrics and Gynecology

## 2012-04-20 DIAGNOSIS — O210 Mild hyperemesis gravidarum: Secondary | ICD-10-CM | POA: Insufficient documentation

## 2012-04-20 DIAGNOSIS — O219 Vomiting of pregnancy, unspecified: Secondary | ICD-10-CM

## 2012-04-20 HISTORY — DX: Urinary tract infection, site not specified: N39.0

## 2012-04-20 HISTORY — DX: Headache: R51

## 2012-04-20 HISTORY — DX: Reserved for concepts with insufficient information to code with codable children: IMO0002

## 2012-04-20 HISTORY — DX: Unspecified abnormal cytological findings in specimens from cervix uteri: R87.619

## 2012-04-20 LAB — CBC
HCT: 35.5 % — ABNORMAL LOW (ref 36.0–46.0)
Hemoglobin: 12.1 g/dL (ref 12.0–15.0)
MCH: 29.6 pg (ref 26.0–34.0)
MCHC: 34.1 g/dL (ref 30.0–36.0)
MCV: 86.8 fL (ref 78.0–100.0)
Platelets: 298 10*3/uL (ref 150–400)
RBC: 4.09 MIL/uL (ref 3.87–5.11)
RDW: 12.9 % (ref 11.5–15.5)
WBC: 10.1 10*3/uL (ref 4.0–10.5)

## 2012-04-20 LAB — URINALYSIS, ROUTINE W REFLEX MICROSCOPIC
Glucose, UA: NEGATIVE mg/dL
Protein, ur: 30 mg/dL — AB

## 2012-04-20 LAB — URINE MICROSCOPIC-ADD ON

## 2012-04-20 MED ORDER — PROMETHAZINE HCL 25 MG RE SUPP: 25.0000 mg | Freq: Four times a day (QID) | RECTAL | Status: DC | PRN

## 2012-04-20 MED ORDER — PROMETHAZINE HCL 25 MG/ML IJ SOLN
25.0000 mg | Freq: Once | INTRAMUSCULAR | Status: AC
  Administered 2012-04-20: 25 mg via INTRAVENOUS
  Filled 2012-04-20: qty 1

## 2012-04-20 NOTE — Discharge Instructions (Signed)
Morning Sickness Morning sickness is when you feel sick to your stomach (nauseous) during pregnancy. You may feel sick to your stomach and throw up (vomit). You may feel sick in the morning, but you can feel this way any time of day. Some women feel very sick to their stomach and cannot stop throwing up (hyperemesis gravidarum). HOME CARE  Take multivitamins as told by your doctor. Taking multivitamins before getting pregnant can stop or lessen the harshness of morning sickness.   Eat dry toast or unsalted crackers before getting out of bed.   Eat 5 to 6 small meals a day.   Eat dry and bland foods like rice and baked potatoes.   Do not drink liquids with meals. Drink between meals.   Do not eat greasy, fatty, or spicy foods.   Have someone cook for you if the smell of food causes you to feel sick or throw up.   Do not take vitamins with iron, or as told by your doctor.   Eat protein when you need a snack (nuts, yogurt, cheese).   Eat unsweetened gelatins for dessert.   Wear a bracelet used for sea sickness (acupressure wristband).   Go to a doctor that puts thin needles into certain body points (acupuncture) to improve how you feel.   Do not smoke.   Use a humidifier to keep the air in your house free of odors.  GET HELP RIGHT AWAY IF:   You feel very sick to your stomach and cannot stop throwing up.   You pass out (faint).   You have a fever.   You need medicine to feel better.   You feel dizzy or lightheaded.   You are losing weight.   You need help knowing what to eat and what not to eat.  MAKE SURE YOU:   Understand these instructions.   Will watch your condition.   Will get help right away if you are not doing well or get worse.  Document Released: 12/06/2004 Document Revised: 10/18/2011 Document Reviewed: 01/26/2010 Exit[employer] Patient Information 2012 [employer]. 

## 2012-04-20 NOTE — MAU Note (Signed)
Pt was seen at urgent care yesterday and dx with UTI. Pt repots having nausea and vomiting and loose stool al night long also report her right leg is numb and she is having difficulty wlaking. Report having back and chest pain.

## 2012-04-20 NOTE — MAU Provider Note (Signed)
History     CSN: [id number]  Arrival date & time 04/20/12  1705   None     Chief Complaint  Patient presents with  . Morning Sickness  . Numbness    (Consider location/radiation/quality/duration/timing/severity/associated sxs/prior treatment) HPI Karina [name] is a 31 y.o. [id number] at [redacted]w[redacted]d. She presents with c/o vomiting all the time x 3 days, her throat is raw, occ tiny specks of blood. Has had R low back pain for a few days. Known IUP from MAU visit 5/18 for vomiting. Had ED visit yesterday for low abd pain, ?exposure to STD, tx with Rocephin, Zithromax and Zofran. Her cultures are neg from that visit.  Past Medical History  Diagnosis Date  . Preterm labor   . Depression   . Chlamydia   . Viral meningitis     Past Surgical History  Procedure Date  . Elective abortion     History reviewed. No pertinent family history.  History  Substance Use Topics  . Smoking status: Current Some Day Smoker  . Smokeless tobacco: Never Used  . Alcohol Use: No     Pt smokes "weed" everyday    OB History    Grav Para Term Preterm Abortions TAB SAB Ect Mult Living   17 6 5 1 7 4 3  0 0 5      Review of Systems  Constitutional: Negative for fever and chills.  Gastrointestinal:       Nausea, vomiting  Genitourinary: Positive for dysuria, frequency and flank pain. Negative for urgency, vaginal bleeding and vaginal discharge.    Allergies  Latex  Home Medications  No current outpatient prescriptions on file.  BP 155/103  Pulse 79  Temp(Src) 97.8 F (36.6 C) (Oral)  Resp 18  Ht 5\' 6"  (1.676 m)  SpO2 100%  LMP 02/14/2012  Physical Exam  Constitutional: She is oriented to person, place, and time. She appears well-developed and well-nourished.       Sitting upright  in the bed, rocking  Abdominal: Soft. She exhibits no distension and no mass. There is tenderness. There is no rebound.  Genitourinary:       Neg CVA tenderness  Musculoskeletal: Normal range of motion.    Neurological: She is alert and oriented to person, place, and time.  Skin: Skin is warm and dry.    ED Course  Procedures (including critical care time)  Labs Reviewed  URINALYSIS, ROUTINE W REFLEX MICROSCOPIC - Abnormal; Notable for the following:    APPearance HAZY (*)    Specific Gravity, Urine >1.030 (*)    Hgb urine dipstick TRACE (*)    Ketones, ur >80 (*)    Protein, ur 30 (*)    Leukocytes, UA TRACE (*)    All other components within normal limits  URINE MICROSCOPIC-ADD ON - Abnormal; Notable for the following:    Squamous Epithelial / LPF MANY (*)    Bacteria, UA FEW (*)    All other components within normal limits  CBC   No results found.   No diagnosis found.    MDM  Pt feeling better, keeping crackers down. Rx Phenergan  supp Preg verification letter to pt

## 2012-04-20 NOTE — MAU Note (Signed)
Asked patient if she was at harm for hurting herself or anyone else in her house hold and patient says "yes, this f-ing baby is at harm because I'm going to the abortion clinic to have it ripped out".

## 2012-05-20 LAB — OB RESULTS CONSOLE HEPATITIS B SURFACE ANTIGEN: Hepatitis B Surface Ag: NEGATIVE

## 2012-05-20 LAB — OB RESULTS CONSOLE RUBELLA ANTIBODY, IGM: Rubella: NON-IMMUNE/NOT IMMUNE

## 2012-06-27 ENCOUNTER — Emergency Department (HOSPITAL_COMMUNITY)
Admission: EM | Admit: 2012-06-27 | Discharge: 2012-06-27 | Disposition: A | Discharge status: Home / Self Care | Payer: Medicaid Other | Source: Home / Self Care

## 2012-06-27 ENCOUNTER — Encounter (HOSPITAL_COMMUNITY): Payer: Self-pay

## 2012-06-27 DIAGNOSIS — B86 Scabies: Secondary | ICD-10-CM

## 2012-06-27 HISTORY — DX: Essential (primary) hypertension: I10

## 2012-06-27 MED ORDER — PERMETHRIN 5 % EX CREA: TOPICAL_CREAM | CUTANEOUS | Status: AC

## 2012-06-27 NOTE — ED Provider Notes (Signed)
History     CSN: [id number]  Arrival date & time 06/27/12  1815   None     Chief Complaint  Patient presents with  . Rash    (Consider location/radiation/quality/duration/timing/severity/associated sxs/prior treatment) Patient is a 31 y.o. female presenting with rash. The history is provided by the patient. No language interpreter was used.  Rash  This is a new problem. The current episode started more than 1 week ago. The problem has not changed since onset.The problem is associated with nothing. There has been no fever. Associated symptoms include itching.   Pt has known scabies exposure Past Medical History  Diagnosis Date  . Preterm labor   . Depression   . Chlamydia   . Viral meningitis   . Abnormal Pap smear   . Headache   . UTI (lower urinary tract infection)   . Hypertension     Past Surgical History  Procedure Date  . Elective abortion     Family History  Problem Relation Age of Onset  . Cancer Mother   . Diabetes Mother   . Hyperlipidemia Mother   . Depression Mother   . Hypertension Mother   . Mental illness Mother   . Arthritis Maternal Grandmother   . Diabetes Maternal Grandmother   . Stroke Maternal Grandmother   . Diabetes Maternal Grandfather   . Hypertension Maternal Grandfather     History  Substance Use Topics  . Smoking status: Current Some Day Smoker  . Smokeless tobacco: Never Used  . Alcohol Use: No     Pt smokes "weed" everyday    OB History    Grav Para Term Preterm Abortions TAB SAB Ect Mult Living   17 6 5 1 7 4 3  0 0 5      Review of Systems  Skin: Positive for itching and rash.  All other systems reviewed and are negative.    Allergies  Latex  Home Medications   Current Outpatient Rx  Name Route Sig Dispense Refill  . METRONIDAZOLE 500 MG PO TABS Oral Take 500 mg by mouth 2 (two) times daily.    Marland Kitchen PERMETHRIN 5 % EX CREA  Apply to affected area once 60 g 1  . PROMETHAZINE HCL 12.5 MG PO TABS Oral Take 12.5 mg  by mouth every 6 (six) hours as needed. For nausea    . PROMETHAZINE HCL 25 MG RE SUPP Rectal Place 1 suppository (25 mg total) rectally every 6 (six) hours as needed for nausea (1/2 - 1 q 4-6 hrs prn vomiting). 25 each 0    BP 113/82  Pulse 86  Temp 99.1 F (37.3 C) (Oral)  Resp 16  SpO2 100%  LMP 02/14/2012  Physical Exam  Nursing note and vitals reviewed. Constitutional: She is oriented to person, place, and time. She appears well-developed and well-nourished.  HENT:  Head: Normocephalic and atraumatic.  Eyes: Conjunctivae and EOM are normal. Pupils are equal, round, and reactive to light.  Neck: Normal range of motion.  Cardiovascular: Normal rate.   Pulmonary/Chest: Effort normal.  Abdominal: Soft.  Musculoskeletal: Normal range of motion.  Neurological: She is alert and oriented to person, place, and time. She has normal reflexes.  Skin: Rash noted.  Psychiatric: She has a normal mood and affect.    ED Course  Procedures (including critical care time)  Labs Reviewed - No data to display No results found.   1. Scabies       MDM  elemite  Lonia Skinner Dawn, [place] 06/27/12 1954

## 2012-06-27 NOTE — ED Notes (Signed)
C/o itchy rash for 3 weeks.  Known contact with scabies.  Pt is pregnant

## 2012-06-28 NOTE — ED Provider Notes (Signed)
Medical screening examination/treatment/procedure(s) were performed by non-physician practitioner and as supervising physician I was immediately available for consultation/collaboration.   [facility]; MD   Sharin Grave, MD 06/28/12 1009

## 2012-09-11 ENCOUNTER — Encounter (HOSPITAL_COMMUNITY): Payer: Self-pay | Admitting: *Deleted

## 2012-09-11 ENCOUNTER — Inpatient Hospital Stay (HOSPITAL_COMMUNITY)
Admission: AD | Admit: 2012-09-11 | Discharge: 2012-09-11 | Disposition: A | Discharge status: Home / Self Care | Payer: Medicaid Other | Source: Ambulatory Visit | Attending: Obstetrics and Gynecology | Admitting: Obstetrics and Gynecology

## 2012-09-11 DIAGNOSIS — O2390 Unspecified genitourinary tract infection in pregnancy, unspecified trimester: Secondary | ICD-10-CM | POA: Insufficient documentation

## 2012-09-11 DIAGNOSIS — B3731 Acute candidiasis of vulva and vagina: Secondary | ICD-10-CM | POA: Insufficient documentation

## 2012-09-11 DIAGNOSIS — Z3490 Encounter for supervision of normal pregnancy, unspecified, unspecified trimester: Secondary | ICD-10-CM

## 2012-09-11 DIAGNOSIS — B373 Candidiasis of vulva and vagina: Secondary | ICD-10-CM

## 2012-09-11 DIAGNOSIS — O479 False labor, unspecified: Secondary | ICD-10-CM

## 2012-09-11 DIAGNOSIS — N949 Unspecified condition associated with female genital organs and menstrual cycle: Secondary | ICD-10-CM | POA: Insufficient documentation

## 2012-09-11 HISTORY — DX: Gestational (pregnancy-induced) hypertension without significant proteinuria, unspecified trimester: O13.9

## 2012-09-11 LAB — URINALYSIS, ROUTINE W REFLEX MICROSCOPIC
Nitrite: NEGATIVE
Protein, ur: NEGATIVE mg/dL
Urobilinogen, UA: 0.2 mg/dL (ref 0.0–1.0)

## 2012-09-11 LAB — URINE MICROSCOPIC-ADD ON

## 2012-09-11 LAB — AMNISURE RUPTURE OF MEMBRANE (ROM) NOT AT ARMC: Amnisure ROM: NEGATIVE

## 2012-09-11 MED ORDER — FLUCONAZOLE 150 MG PO TABS
150.0000 mg | ORAL_TABLET | Freq: Once | ORAL | Status: AC
  Administered 2012-09-11: 150 mg via ORAL
  Filled 2012-09-11: qty 1

## 2012-09-11 NOTE — MAU Note (Signed)
Noted ? Leaking since 0430 when got up, feels watery, but looks creamy.  Has been having shooting pains in abd pains started today.  Leg went numb at work today.

## 2012-09-11 NOTE — MAU Provider Note (Signed)
History     CSN: [id number]  Arrival date and time: 09/11/12 1249   First Provider Initiated Contact with Patient 09/11/12 1336      Chief Complaint  Patient presents with  . Vaginal Discharge  . Abdominal Pain   HPI Karina [name] is a 31 y.o. female @ [redacted]w[redacted]d gestation who presents to MAU with vaginal discharge. The discharge has been there since 4 am and continues. She is concerned that her water may have broke because she had similar leaking with last pregnancy that was preterm. Last sexual intercourse 3 days ago. The history was provided by the patient.  OB History    Grav Para Term Preterm Abortions TAB SAB Ect Mult Living   14 6 5 1 7 4 3  0 0 5      Past Medical History  Diagnosis Date  . Preterm labor   . Depression   . Chlamydia   . Viral meningitis   . Abnormal Pap smear   . Headache   . UTI (lower urinary tract infection)   . Hypertension   . Pregnancy induced hypertension     Past Surgical History  Procedure Date  . Elective abortion     Family History  Problem Relation Age of Onset  . Cancer Mother   . Diabetes Mother   . Hyperlipidemia Mother   . Depression Mother   . Hypertension Mother   . Mental illness Mother   . Arthritis Maternal Grandmother   . Diabetes Maternal Grandmother   . Stroke Maternal Grandmother   . Diabetes Maternal Grandfather   . Hypertension Maternal Grandfather     History  Substance Use Topics  . Smoking status: Current Some Day Smoker  . Smokeless tobacco: Never Used  . Alcohol Use: No     Pt smokes "weed" everyday    Allergies:  Allergies  Allergen Reactions  . Latex Itching and Rash    Prescriptions prior to admission  Medication Sig Dispense Refill  . Prenatal Vit-Fe Fumarate-FA (PRENATAL MULTIVITAMIN) TABS Take 1 tablet by mouth every morning.      . Pyridoxine HCl (B-6 PO) Take 1 tablet by mouth daily.        ROS: as stated in HPI Physical Exam   Blood pressure 127/70, pulse 91, temperature 98.3  F (36.8 C), temperature source Oral, resp. rate 20, height 5' 4.5" (1.638 m), weight 177 lb (80.287 kg), last menstrual period 02/14/2012.  Physical Exam  Nursing note and vitals reviewed. Constitutional: She is oriented to person, place, and time. She appears well-developed and well-nourished. No distress.  HENT:  Head: Normocephalic and atraumatic.  Eyes: EOM are normal.  Neck: Neck supple.  Cardiovascular: Normal rate.   Respiratory: Effort normal.  GI: Soft. There is no tenderness.  Genitourinary:       External genitalia without lesions. Thick white cheesy discharge vaginal vault, consistent with yeast. No pooling noted. Dilation:  (Outer os 1 cm, inner os closed.) Effacement (%): Thick Exam by:: Mayer Camel NP   Musculoskeletal: Normal range of motion.  Neurological: She is alert and oriented to person, place, and time.  Skin: Skin is warm and dry.  Psychiatric: She has a normal mood and affect. Her behavior is normal. Judgment and thought content normal.   Results for orders placed during the hospital encounter of 09/11/12 (from the past 24 hour(s))  URINALYSIS, ROUTINE W REFLEX MICROSCOPIC     Status: Abnormal   Collection Time   09/11/12  1:07 PM  Component Value Range   Color, Urine YELLOW  YELLOW   APPearance CLEAR  CLEAR   Specific Gravity, Urine 1.025  1.005 - 1.030   pH 7.0  5.0 - 8.0   Glucose, UA NEGATIVE  NEGATIVE mg/dL   Hgb urine dipstick NEGATIVE  NEGATIVE   Bilirubin Urine NEGATIVE  NEGATIVE   Ketones, ur 15 (*) NEGATIVE mg/dL   Protein, ur NEGATIVE  NEGATIVE mg/dL   Urobilinogen, UA 0.2  0.0 - 1.0 mg/dL   Nitrite NEGATIVE  NEGATIVE   Leukocytes, UA SMALL (*) NEGATIVE  URINE MICROSCOPIC-ADD ON     Status: Abnormal   Collection Time   09/11/12  1:07 PM      Component Value Range   Squamous Epithelial / LPF MANY (*) RARE   WBC, UA 3-6  <3 WBC/hpf   Bacteria, UA FEW (*) RARE   Urine-Other MUCOUS PRESENT    AMNISURE RUPTURE OF MEMBRANE (ROM)      Status: Normal   Collection Time   09/11/12  1:50 PM      Component Value Range   Amnisure ROM NEGATIVE    WET PREP, GENITAL     Status: Abnormal   Collection Time   09/11/12  1:50 PM      Component Value Range   Yeast Wet Prep HPF POC MANY (*) NONE SEEN   Trich, Wet Prep NONE SEEN  NONE SEEN   Clue Cells Wet Prep HPF POC FEW (*) NONE SEEN   WBC, Wet Prep HPF POC MODERATE (*) NONE SEEN   EFM: Baseline 140, reactive, no contractions  Assessment: 31 y.o. female @ [redacted]w[redacted]d gestation with vaginal discharge   Monilia vulvovaginitis  Plan:  Diflucan 150 mg PO now   Follow up in the office or return here as needed.  Discussed with the patient and all questioned fully answered. She will return if any problems arise.   Medication List     As of 09/11/2012  2:53 PM    CONTINUE taking these medications         B-6 PO      prenatal multivitamin Tabs         MAU Course: Discussed with Dr. Ambrose Mantle.  Procedures  Ovetta Bazzano, RN, FNP, [facility] 09/11/2012, 1:40 PM

## 2012-09-13 LAB — URINE CULTURE

## 2012-10-19 ENCOUNTER — Inpatient Hospital Stay (HOSPITAL_COMMUNITY)
Admission: AD | Admit: 2012-10-19 | Discharge: 2012-10-21 | DRG: 775 | Disposition: A | Discharge status: Home / Self Care | Payer: Medicaid Other | Source: Ambulatory Visit | Attending: Obstetrics and Gynecology | Admitting: Obstetrics and Gynecology

## 2012-10-19 ENCOUNTER — Encounter (HOSPITAL_COMMUNITY): Payer: Self-pay | Admitting: Family

## 2012-10-19 DIAGNOSIS — O42919 Preterm premature rupture of membranes, unspecified as to length of time between rupture and onset of labor, unspecified trimester: Secondary | ICD-10-CM

## 2012-10-19 DIAGNOSIS — Z22330 Carrier of Group B streptococcus: Secondary | ICD-10-CM

## 2012-10-19 DIAGNOSIS — O4290 Premature rupture of membranes, unspecified as to length of time between rupture and onset of labor, unspecified weeks of gestation: Principal | ICD-10-CM | POA: Diagnosis present

## 2012-10-19 DIAGNOSIS — O99892 Other specified diseases and conditions complicating childbirth: Secondary | ICD-10-CM | POA: Diagnosis present

## 2012-10-19 HISTORY — DX: Unspecified infectious disease: B99.9

## 2012-10-19 HISTORY — DX: Reserved for concepts with insufficient information to code with codable children: IMO0002

## 2012-10-19 LAB — CBC
MCH: 27.6 pg (ref 26.0–34.0)
Platelets: 325 10*3/uL (ref 150–400)
RBC: 3.69 MIL/uL — ABNORMAL LOW (ref 3.87–5.11)
WBC: 9.9 10*3/uL (ref 4.0–10.5)

## 2012-10-19 LAB — GROUP B STREP BY PCR: Group B strep by PCR: POSITIVE — AB

## 2012-10-19 LAB — TYPE AND SCREEN
ABO/RH(D): B POS
Antibody Screen: NEGATIVE

## 2012-10-19 MED ORDER — MEASLES, MUMPS & RUBELLA VAC ~~LOC~~ INJ
0.5000 mL | INJECTION | Freq: Once | SUBCUTANEOUS | Status: DC
  Filled 2012-10-19: qty 0.5

## 2012-10-19 MED ORDER — TERBUTALINE SULFATE 1 MG/ML IJ SOLN: 0.2500 mg | Freq: Once | INTRAMUSCULAR | Status: DC | PRN

## 2012-10-19 MED ORDER — ONDANSETRON HCL 4 MG PO TABS: 4.0000 mg | ORAL_TABLET | ORAL | Status: DC | PRN

## 2012-10-19 MED ORDER — OXYTOCIN 40 UNITS IN LACTATED RINGERS INFUSION - SIMPLE MED: 62.5000 mL/h | INTRAVENOUS | Status: AC | PRN

## 2012-10-19 MED ORDER — LIDOCAINE HCL (PF) 1 % IJ SOLN
30.0000 mL | INTRAMUSCULAR | Status: DC | PRN
  Filled 2012-10-19: qty 30

## 2012-10-19 MED ORDER — ONDANSETRON HCL 4 MG/2ML IJ SOLN: 4.0000 mg | INTRAMUSCULAR | Status: DC | PRN

## 2012-10-19 MED ORDER — LACTATED RINGERS IV SOLN
500.0000 mL | INTRAVENOUS | Status: DC | PRN
  Administered 2012-10-19: 500 mL via INTRAVENOUS

## 2012-10-19 MED ORDER — PENICILLIN G POTASSIUM 5000000 UNITS IJ SOLR
2.5000 10*6.[IU] | INTRAVENOUS | Status: DC
  Administered 2012-10-19: 2.5 10*6.[IU] via INTRAVENOUS
  Filled 2012-10-19 (×3): qty 2.5

## 2012-10-19 MED ORDER — ONDANSETRON HCL 4 MG/2ML IJ SOLN: 4.0000 mg | Freq: Four times a day (QID) | INTRAMUSCULAR | Status: DC | PRN

## 2012-10-19 MED ORDER — SENNOSIDES-DOCUSATE SODIUM 8.6-50 MG PO TABS
2.0000 | ORAL_TABLET | Freq: Every day | ORAL | Status: DC
  Administered 2012-10-20: 2 via ORAL

## 2012-10-19 MED ORDER — PRENATAL MULTIVITAMIN CH
1.0000 | ORAL_TABLET | Freq: Every day | ORAL | Status: DC
  Administered 2012-10-20: 1 via ORAL

## 2012-10-19 MED ORDER — ACETAMINOPHEN 325 MG PO TABS: 650.0000 mg | ORAL_TABLET | ORAL | Status: DC | PRN

## 2012-10-19 MED ORDER — PENICILLIN G POTASSIUM 5000000 UNITS IJ SOLR
5.0000 10*6.[IU] | Freq: Once | INTRAMUSCULAR | Status: AC
  Administered 2012-10-19: 5 10*6.[IU] via INTRAVENOUS
  Filled 2012-10-19: qty 5

## 2012-10-19 MED ORDER — PNEUMOCOCCAL VAC POLYVALENT 25 MCG/0.5ML IJ INJ: 0.5000 mL | INJECTION | INTRAMUSCULAR | Status: DC

## 2012-10-19 MED ORDER — DIPHENHYDRAMINE HCL 25 MG PO CAPS: 25.0000 mg | ORAL_CAPSULE | Freq: Four times a day (QID) | ORAL | Status: DC | PRN

## 2012-10-19 MED ORDER — OXYCODONE-ACETAMINOPHEN 5-325 MG PO TABS: 1.0000 | ORAL_TABLET | ORAL | Status: DC | PRN

## 2012-10-19 MED ORDER — PNEUMOCOCCAL VAC POLYVALENT 25 MCG/0.5ML IJ INJ
0.5000 mL | INJECTION | Freq: Once | INTRAMUSCULAR | Status: DC
  Filled 2012-10-19: qty 0.5

## 2012-10-19 MED ORDER — CITRIC ACID-SODIUM CITRATE 334-500 MG/5ML PO SOLN: 30.0000 mL | ORAL | Status: DC | PRN

## 2012-10-19 MED ORDER — TETANUS-DIPHTH-ACELL PERTUSSIS 5-2.5-18.5 LF-MCG/0.5 IM SUSP: 0.5000 mL | Freq: Once | INTRAMUSCULAR | Status: DC

## 2012-10-19 MED ORDER — PRENATAL MULTIVITAMIN CH
1.0000 | ORAL_TABLET | Freq: Every morning | ORAL | Status: DC
  Administered 2012-10-21: 1 via ORAL
  Filled 2012-10-19 (×2): qty 1

## 2012-10-19 MED ORDER — PENICILLIN G POTASSIUM 5000000 UNITS IJ SOLR
2.5000 10*6.[IU] | INTRAVENOUS | Status: DC
  Filled 2012-10-19 (×3): qty 2.5

## 2012-10-19 MED ORDER — ZOLPIDEM TARTRATE 5 MG PO TABS: 5.0000 mg | ORAL_TABLET | Freq: Every evening | ORAL | Status: DC | PRN

## 2012-10-19 MED ORDER — LANOLIN HYDROUS EX OINT: TOPICAL_OINTMENT | CUTANEOUS | Status: DC | PRN

## 2012-10-19 MED ORDER — SIMETHICONE 80 MG PO CHEW: 80.0000 mg | CHEWABLE_TABLET | ORAL | Status: DC | PRN

## 2012-10-19 MED ORDER — OXYTOCIN BOLUS FROM INFUSION: 500.0000 mL | INTRAVENOUS | Status: DC

## 2012-10-19 MED ORDER — WITCH HAZEL-GLYCERIN EX PADS: 1.0000 "application " | MEDICATED_PAD | CUTANEOUS | Status: DC | PRN

## 2012-10-19 MED ORDER — OXYCODONE-ACETAMINOPHEN 5-325 MG PO TABS
1.0000 | ORAL_TABLET | ORAL | Status: DC | PRN
  Administered 2012-10-19 – 2012-10-21 (×5): 1 via ORAL
  Filled 2012-10-19: qty 2
  Filled 2012-10-19 (×3): qty 1
  Filled 2012-10-19: qty 2

## 2012-10-19 MED ORDER — IBUPROFEN 600 MG PO TABS
600.0000 mg | ORAL_TABLET | Freq: Four times a day (QID) | ORAL | Status: DC | PRN
  Filled 2012-10-19: qty 1

## 2012-10-19 MED ORDER — LACTATED RINGERS IV SOLN
INTRAVENOUS | Status: DC
  Administered 2012-10-19: 14:00:00 via INTRAVENOUS

## 2012-10-19 MED ORDER — DIBUCAINE 1 % RE OINT: 1.0000 "application " | TOPICAL_OINTMENT | RECTAL | Status: DC | PRN

## 2012-10-19 MED ORDER — BUTORPHANOL TARTRATE 1 MG/ML IJ SOLN
INTRAMUSCULAR | Status: AC
  Administered 2012-10-19: 1 mg via INTRAVENOUS
  Filled 2012-10-19: qty 1

## 2012-10-19 MED ORDER — OXYTOCIN 40 UNITS IN LACTATED RINGERS INFUSION - SIMPLE MED
1.0000 m[IU]/min | INTRAVENOUS | Status: DC
  Administered 2012-10-19: 1 m[IU]/min via INTRAVENOUS
  Filled 2012-10-19: qty 1000

## 2012-10-19 MED ORDER — BENZOCAINE-MENTHOL 20-0.5 % EX AERO
1.0000 "application " | INHALATION_SPRAY | CUTANEOUS | Status: DC | PRN
  Filled 2012-10-19: qty 56

## 2012-10-19 MED ORDER — LACTATED RINGERS IV SOLN: INTRAVENOUS | Status: AC

## 2012-10-19 MED ORDER — OXYTOCIN 40 UNITS IN LACTATED RINGERS INFUSION - SIMPLE MED: 62.5000 mL/h | INTRAVENOUS | Status: DC

## 2012-10-19 MED ORDER — BUTORPHANOL TARTRATE 1 MG/ML IJ SOLN
1.0000 mg | Freq: Once | INTRAMUSCULAR | Status: AC
  Administered 2012-10-19: 1 mg via INTRAVENOUS

## 2012-10-19 MED ORDER — IBUPROFEN 600 MG PO TABS
600.0000 mg | ORAL_TABLET | Freq: Four times a day (QID) | ORAL | Status: DC
  Administered 2012-10-19 – 2012-10-21 (×6): 600 mg via ORAL
  Filled 2012-10-19 (×5): qty 1

## 2012-10-19 MED ORDER — B-6 50 MG PO TABS
ORAL_TABLET | Freq: Every morning | ORAL | Status: DC
  Administered 2012-10-20 – 2012-10-21 (×2): 50 mg via ORAL
  Filled 2012-10-19 (×2): qty 50

## 2012-10-19 NOTE — MAU Note (Signed)
Patient reports at 1130 she felt a trickle of fluid and then a flow when she stood up; denies vaginal bleeding.

## 2012-10-19 NOTE — Progress Notes (Addendum)
Patient ID: Karina [name], female   DOB: 11-05-1981, 31 y.o.   MRN: [id number] Delivery note:  The pt progressed rapidly to full dilatation and delivered a living female infant spontaneously ROA over an intact perineum Apgars 8 at 1 and 9 at 5 minutes The placenta delivered intact and the uterus was normal. There were no lacerations. The pt did not push well but there was no shoulder dystocia. The NICU was notified prior to delivery and was available if there was a problem EBL 300 cc's

## 2012-10-19 NOTE — Progress Notes (Signed)
Dr. Ambrose Mantle called for an update. RN notifed him of uterine activity and variable decels. MD instructed to titrate pitocin every 30 min if possible.

## 2012-10-19 NOTE — H&P (Signed)
[name], [name] NO.:  [id number]  MEDICAL RECORD NO.:  [id number]  LOCATION:  9169                          FACILITY:  [facility]  PHYSICIAN:  Malachi Pro. Ambrose Mantle, M.D. DATE OF BIRTH:  [birth year]  DATE OF ADMISSION:  10/19/2012 DATE OF DISCHARGE:                             HISTORY & PHYSICAL   HISTORY OF PRESENT ILLNESS:  This is a 31 year old black female, para 5- 1-7-5, gravida 22, [facility] November 22, 2012 by 6-week ultrasound, admitted with premature rupture of the membranes.  The patient states that she had spontaneous rupture of membranes at approximately 11:30 a.m. on October 19, 2012.  She came to the maternity admission unit where fern test was positive.  Blood group and type B positive, negative antibody, rubella immune, RPR nonreactive, urine culture negative.  Hepatitis B surface antigen negative, HIV negative, GC and Chlamydia negative. Hemoglobin AA first trimester screen negative.  AFP normal.  Cystic fibrosis screening negative.  One hour Glucola 87, rapid group B strep test is in progress but one has not been done prior to now.  The patient's prenatal course was relatively uncomplicated.  She did admit to smoking marijuana daily and she did have a history of one still birth with two normal deliveries since then, even though her due date of November 22, 2012 is based on a 6-week ultrasound.  An ultrasound at 13 weeks and at 18 weeks, both suggested due date was November 17, 2012.  The patient began her prenatal course at our office at 14-2/7th weeks gestation, with a weight of 164 pounds.  At her last prenatal visit on October 17, 2012, she reported a weight of 187 pounds.  ALLERGIES:  Her past medical history reveals no latex allergy.  No drug allergies.  MEDICAL HISTORY:  History of Chlamydia in 2007, premature delivery surgical history therapeutic abortions x3.  FAMILY MEDICAL HISTORY:  Father with hypertension.  Paternal grandmother with  diabetes.  REPRODUCTIVE HISTORY:  The patient has a history of 5 term deliveries with the weight ranging from 5 pounds 4 ounces to 6 pounds 15 ounces. In 2005, she had a still birth vaginal delivery at term and she has a history of a premature delivery in March, 2001 at 34 weeks.  The baby weighed 3 pounds 2 ounces.  She also gives a history of four spontaneous abortions and three therapeutic abortion.  She denies alcohol use.  She denies tobacco use, but she admits to having smoked marijuana daily at one time during her pregnancy she had not smoked marijuana in 3 weeks.  PHYSICAL EXAMINATION:  VITAL SIGNS:  On admission, blood pressure 122/76, pulse 80, respiration 20, temperature 98.4. HEART:  Normal size and sounds.  No murmurs. LUNGS:  Clear to auscultation.  Fundal height at her visit on October 17, 2012 was 35 cm.  The cervix is a fingertip dilated 10% to 20% effaced, vertex is at -4 station.  ADMITTING IMPRESSION:  Intrauterine pregnancy at 35 weeks and 1 day by a 6-week ultrasound with unknown group B strep status and premature rupture of the membranes.  The patient has been started on Pitocin.  A rapid group B strep test is in progress.  She will be given an initial dose of penicillin 5 million units.  We will observe for progress in labor.  She feels like she will not want an epidural.     Malachi Pro. Ambrose Mantle, M.D.     TFH/MEDQ  D:  10/19/2012  T:  10/19/2012  Job:  [id number]

## 2012-10-19 NOTE — Progress Notes (Addendum)
Patient ID: Karina [name], female   DOB: 02/13/1981, 31 y.o.   MRN: [id number] Pitocin is at 7 mu/minute and her contractions are q 2-3 minutes. The pt states they have been painful since 4:30 PM. She declines an epidural Per the RN exam at 6:30 PM the cervix was 3 cm 100% effaced and the vertex was at - 2 station. The FHR is normal

## 2012-10-19 NOTE — Progress Notes (Signed)
Patient ID: Karina [name], female   DOB: 1981/03/02, 31 y.o.   MRN: [id number] Pt is feeling a lot of pressure and pain but the cervix is 4 cm 80-90 % effaced and the vertex is at 0 station.

## 2012-10-20 LAB — CBC
MCH: 27.5 pg (ref 26.0–34.0)
MCHC: 32.5 g/dL (ref 30.0–36.0)
Platelets: 312 10*3/uL (ref 150–400)
RDW: 12.8 % (ref 11.5–15.5)

## 2012-10-20 LAB — RPR: RPR Ser Ql: NONREACTIVE

## 2012-10-20 NOTE — Progress Notes (Signed)
Patient ID: Karina [name], female   DOB: 1981-06-10, 31 y.o.   MRN: [id number] #1 afebrile BP slightly elevated but no headache or epigastric pain Will observe for now.

## 2012-10-20 NOTE — Plan of Care (Signed)
Problem: Discharge Progression Outcomes Goal: MMR given as ordered Outcome: Not Met (add Reason) Needs MMR ( non immune)     

## 2012-10-20 NOTE — Progress Notes (Signed)
UR chart review completed.  

## 2012-10-20 NOTE — Progress Notes (Signed)
CSW attempted to meet with pt to assess her situation & inquired about MJ use.  Pt was reluctant to speak to this CSW and appeared upset.  She hasn't named the infant yet.  She told CSW that she smoked MJ everyday, prior to pregnancy confirmation at 3 weeks.  Once pregnancy was confirmed, she stopped smoking and denies other illegal substance use.  CSW explained hospital drug testing policy.  UDS is negative, meconium results are pending.  She does not have any supplies for the infant at this time but states she has the resources to purchase supplies.  She declined to discuss support system, as she states "I got this."  CSW unable to engage pt in conversation to obtain more information.  CSW will monitor drug screen results and make a referral if needed.  

## 2012-10-21 DIAGNOSIS — O42919 Preterm premature rupture of membranes, unspecified as to length of time between rupture and onset of labor, unspecified trimester: Secondary | ICD-10-CM

## 2012-10-21 MED ORDER — OXYCODONE-ACETAMINOPHEN 5-325 MG PO TABS: 1.0000 | ORAL_TABLET | Freq: Four times a day (QID) | ORAL | Status: DC | PRN

## 2012-10-21 MED ORDER — FERROUS SULFATE 325 (65 FE) MG PO TBEC: 325.0000 mg | DELAYED_RELEASE_TABLET | Freq: Two times a day (BID) | ORAL | Status: DC

## 2012-10-21 MED ORDER — IBUPROFEN 600 MG PO TABS: 600.0000 mg | ORAL_TABLET | Freq: Four times a day (QID) | ORAL | Status: DC | PRN

## 2012-10-21 NOTE — Discharge Summary (Signed)
NAMEJENEL, GIERKE NO.:  [id number]  MEDICAL RECORD NO.:  [id number]  LOCATION:  9132                          [facility]ACILITY:  [facility]  PHYSICIAN:  Malachi Pro. Ambrose Mantle, M.D. DATE OF BIRTH:  October 06, 1981  DATE OF ADMISSION:  10/19/2012 DATE OF DISCHARGE:  10/21/2012                              DISCHARGE SUMMARY   A 31 year old black female, para 5-1-7-5, gravida 70, [facility] November 22, 2012 by 6 week ultrasound, admitted with premature rupture of the membranes.  The patient stated she had premature rupture of the membranes at 11:30 a.m. on October 19, 2012.  She came to maternity admission where fern test was positive.  Blood group and type B positive, negative antibody, rubella immune, RPR nonreactive, urine culture negative.  Hepatitis B surface antigen negative, HIV negative, GC and Chlamydia negative.  Hemoglobin AA.  First trimester screen, AFP, and cystic fibrosis screens all negative.  One hour Glucola was 87.  A rapid group B strep done in the maternity admission unit was positive. The patient was admitted after confirming rupture of membranes and after the positive group B strep was placed on penicillin.  She was placed on penicillin and Pitocin.  Her prenatal course was complicated mainly by the history of marijuana use on a daily basis.  By 7:07 p.m. on October 19, 2012, the Pitocin was at 7 milliunits a minute.  Contractions every 2- 3 minutes.  She had significant pain since 4:30 p.m.  At 6:30 p.m., the RN examined the patient, found to be 3 cm and 100% effaced.  At 7:37 p.m., she was feeling a lot of pressure.  Cervix was 4 cm, 80-90%, vertex was at a 0 station.  She progressed rapidly to full dilatation and delivered a living female infant spontaneously ROA over an intact perineum by Dr. Ambrose Mantle.  Apgars were 8 at 1 and 9 at 5 minutes. Placenta delivered intact and the uterus was normal.  There were no lacerations.  The patient did not push well but there was no  shoulder dystocia.  The NICU she was notified prior to delivery and was available if there was a problem but they were not called.  Blood loss was 300 mL. Postpartum, the patient did well and was discharged on the second postpartum day.  Initial hemoglobin was 10.2, hematocrit 31.3, white count 9900, platelet count 325,000.  Followup hemoglobin 9.8.  RPR was nonreactive.  Group B strep and the PCR was positive.  FINAL DIAGNOSES:  Intrauterine pregnancy, 35 weeks and 1 day, delivered right occiput anterior, preterm, premature rupture of the membranes, premature delivery, positive group B strep.  OPERATION:  Spontaneous delivery ROA.  FINAL CONDITION:  Improved.  Instructions include our regular discharge instruction booklet.  The patient is advised to return to the office in 6 weeks for followup examination.  DISCHARGE MEDICATIONS: 1. Ferrous sulfate 325, 2 tablets a day. 2. Prenatal vitamins 1 a day. 3. Motrin 600 mg 30 tablets, 1 every 6 hours as needed for pain. 4. Percocet 5/325, 30 tablets, 1 every 6 hours as needed for pain.  She is advised to return to the office in 6 weeks for followup examination.  Malachi Pro. Ambrose Mantle, M.D.     TFH/MEDQ  D:  10/21/2012  T:  10/21/2012  Job:  [id number]

## 2012-10-21 NOTE — Progress Notes (Signed)
Patient ID: Karina [name], female   DOB: 09/02/1981, 31 y.o.   MRN: [id number] #2 afebrile BP normal for d/c

## 2013-06-01 ENCOUNTER — Emergency Department (HOSPITAL_COMMUNITY)
Admission: EM | Admit: 2013-06-01 | Discharge: 2013-06-02 | Disposition: A | Discharge status: Home / Self Care | Payer: Medicaid Other | Attending: Emergency Medicine | Admitting: Emergency Medicine

## 2013-06-01 ENCOUNTER — Encounter (HOSPITAL_COMMUNITY): Payer: Self-pay | Admitting: *Deleted

## 2013-06-01 DIAGNOSIS — F329 Major depressive disorder, single episode, unspecified: Secondary | ICD-10-CM | POA: Insufficient documentation

## 2013-06-01 DIAGNOSIS — Z91040 Latex allergy status: Secondary | ICD-10-CM | POA: Insufficient documentation

## 2013-06-01 DIAGNOSIS — Z8619 Personal history of other infectious and parasitic diseases: Secondary | ICD-10-CM | POA: Insufficient documentation

## 2013-06-01 DIAGNOSIS — Z8661 Personal history of infections of the central nervous system: Secondary | ICD-10-CM | POA: Insufficient documentation

## 2013-06-01 DIAGNOSIS — K029 Dental caries, unspecified: Secondary | ICD-10-CM | POA: Insufficient documentation

## 2013-06-01 DIAGNOSIS — Z87440 Personal history of urinary (tract) infections: Secondary | ICD-10-CM | POA: Insufficient documentation

## 2013-06-01 DIAGNOSIS — Z8742 Personal history of other diseases of the female genital tract: Secondary | ICD-10-CM | POA: Insufficient documentation

## 2013-06-01 DIAGNOSIS — Z8669 Personal history of other diseases of the nervous system and sense organs: Secondary | ICD-10-CM | POA: Insufficient documentation

## 2013-06-01 DIAGNOSIS — I10 Essential (primary) hypertension: Secondary | ICD-10-CM | POA: Insufficient documentation

## 2013-06-01 DIAGNOSIS — F17200 Nicotine dependence, unspecified, uncomplicated: Secondary | ICD-10-CM | POA: Insufficient documentation

## 2013-06-01 DIAGNOSIS — K089 Disorder of teeth and supporting structures, unspecified: Secondary | ICD-10-CM | POA: Insufficient documentation

## 2013-06-01 DIAGNOSIS — K0889 Other specified disorders of teeth and supporting structures: Secondary | ICD-10-CM

## 2013-06-01 DIAGNOSIS — F3289 Other specified depressive episodes: Secondary | ICD-10-CM | POA: Insufficient documentation

## 2013-06-01 NOTE — ED Notes (Signed)
Pt states top left tooth is broken and painful. Pt states tooth has been broken for several days and when she was eating this evening her pain was increased when another part of her tooth broke off.

## 2013-06-01 NOTE — ED Notes (Signed)
The pt has had a  Toothache for 3 days since she broke off a tooth eating a sandwich with bacon in

## 2013-06-02 MED ORDER — PENICILLIN V POTASSIUM 250 MG PO TABS
500.0000 mg | ORAL_TABLET | Freq: Once | ORAL | Status: AC
  Administered 2013-06-02: 500 mg via ORAL
  Filled 2013-06-02: qty 2

## 2013-06-02 MED ORDER — HYDROCODONE-ACETAMINOPHEN 5-325 MG PO TABS
1.0000 | ORAL_TABLET | ORAL | Status: DC | PRN
Start: 2013-06-02 — End: 2013-08-13

## 2013-06-02 MED ORDER — HYDROCODONE-ACETAMINOPHEN 5-325 MG PO TABS
1.0000 | ORAL_TABLET | Freq: Once | ORAL | Status: AC
  Administered 2013-06-02: 1 via ORAL
  Filled 2013-06-02: qty 1

## 2013-06-02 MED ORDER — PENICILLIN V POTASSIUM 500 MG PO TABS: 500.0000 mg | ORAL_TABLET | Freq: Three times a day (TID) | ORAL | Status: DC

## 2013-06-02 NOTE — ED Provider Notes (Signed)
Medical screening examination/treatment/procedure(s) were performed by non-physician practitioner and as supervising physician I was immediately available for consultation/collaboration.   Joslin Doell, MD 06/02/13 0457 

## 2013-06-02 NOTE — ED Provider Notes (Signed)
History    CSN: [id number] Arrival date & time 06/01/13  2328  None    Chief Complaint  Patient presents with  . Dental Pain   (Consider location/radiation/quality/duration/timing/severity/associated sxs/prior Treatment) HPI History provided by pt.   Pt developed severe right upper dental pain 3 days ago.  Radiates to right side of face.  No relief w/ ibuprofen 800mg .  No associated fever.  Has had these sx intermittently for 2 years.  Denies trauma.  Does not have a dentist. Past Medical History  Diagnosis Date  . Preterm labor   . Depression   . Chlamydia   . Viral meningitis   . Abnormal Pap smear   . Headache(784.0)   . UTI (lower urinary tract infection)   . Hypertension   . Pregnancy induced hypertension   . Infection   . IUFD (intrauterine fetal death) 09/20/04    Term IUFD at 40 weeks, unknown cause    Past Surgical History  Procedure Laterality Date  . Elective abortion     Family History  Problem Relation Age of Onset  . Cancer Mother   . Diabetes Mother   . Hyperlipidemia Mother   . Depression Mother   . Hypertension Mother   . Mental illness Mother   . Arthritis Maternal Grandmother   . Diabetes Maternal Grandmother   . Stroke Maternal Grandmother   . Diabetes Maternal Grandfather   . Hypertension Maternal Grandfather    History  Substance Use Topics  . Smoking status: Current Some Day Smoker  . Smokeless tobacco: Never Used  . Alcohol Use: No     Comment: Pt smokes "weed" everyday   OB History   Grav Para Term Preterm Abortions TAB SAB Ect Mult Living   14 7 5 2 7 4 3  0 0 6     Review of Systems  All other systems reviewed and are negative.    Allergies  Latex  Home Medications   Current Outpatient Rx  Name  Route  Sig  Dispense  Refill  . ibuprofen (ADVIL,MOTRIN) 800 MG tablet   Oral   Take 800 mg by mouth every 8 (eight) hours as needed for pain.         Marland Kitchen HYDROcodone-acetaminophen (NORCO/VICODIN) 5-325 MG per tablet  Oral   Take 1 tablet by mouth every 4 (four) hours as needed for pain.   12 tablet   0   . penicillin v potassium (VEETID) 500 MG tablet   Oral   Take 1 tablet (500 mg total) by mouth 3 (three) times daily.   30 tablet   0    BP 132/94  Pulse 60  Temp(Src) 98.3 F (36.8 C) (Oral)  Resp 16  SpO2 99% Physical Exam  Nursing note and vitals reviewed. Constitutional: She is oriented to person, place, and time. She appears well-developed and well-nourished. No distress.  Pt is holding the right side of her face and rocking.    HENT:  Head: Normocephalic and atraumatic. No trismus in the jaw.  Mouth/Throat: Uvula is midline and oropharynx is clear and moist.  Slight edema of right cheek compared to left.  Right upper third molar w/out obvious caries or tenderness.  Adjacent gingiva appears normal.  Eyes:  Normal appearance  Neck: Normal range of motion. Neck supple.  No submandibular edema  Pulmonary/Chest: Effort normal.  Musculoskeletal: Normal range of motion.  Lymphadenopathy:    She has no cervical adenopathy.  Neurological: She is alert and oriented to person, place,  and time.  Psychiatric: She has a normal mood and affect. Her behavior is normal.    ED Course  Procedures (including critical care time) Labs Reviewed - No data to display No results found. 1. Pain, dental     MDM  32yo F presents w/ non-traumatic R upper third molar pain.  This is a recurrent problem.  Molar is not completely erupted.  Will treat for possible periapical abscess or pericoronitis.  Referred to dentistry and prescribed penicillin and 12 vicodin.  She received first dose of both in ED.  Return precautions discussed.   Otilio Miu, PA-C 06/02/13 [phone]

## 2013-08-13 ENCOUNTER — Encounter (HOSPITAL_COMMUNITY): Payer: Self-pay | Admitting: *Deleted

## 2013-08-13 ENCOUNTER — Emergency Department (HOSPITAL_COMMUNITY)
Admission: EM | Admit: 2013-08-13 | Discharge: 2013-08-13 | Disposition: A | Discharge status: Home / Self Care | Payer: Medicaid Other | Attending: Emergency Medicine | Admitting: Emergency Medicine

## 2013-08-13 DIAGNOSIS — Z87440 Personal history of urinary (tract) infections: Secondary | ICD-10-CM | POA: Insufficient documentation

## 2013-08-13 DIAGNOSIS — Z91040 Latex allergy status: Secondary | ICD-10-CM | POA: Insufficient documentation

## 2013-08-13 DIAGNOSIS — K0889 Other specified disorders of teeth and supporting structures: Secondary | ICD-10-CM

## 2013-08-13 DIAGNOSIS — K029 Dental caries, unspecified: Secondary | ICD-10-CM | POA: Insufficient documentation

## 2013-08-13 DIAGNOSIS — I10 Essential (primary) hypertension: Secondary | ICD-10-CM | POA: Insufficient documentation

## 2013-08-13 DIAGNOSIS — Z8619 Personal history of other infectious and parasitic diseases: Secondary | ICD-10-CM | POA: Insufficient documentation

## 2013-08-13 DIAGNOSIS — K089 Disorder of teeth and supporting structures, unspecified: Secondary | ICD-10-CM | POA: Insufficient documentation

## 2013-08-13 DIAGNOSIS — F17200 Nicotine dependence, unspecified, uncomplicated: Secondary | ICD-10-CM | POA: Insufficient documentation

## 2013-08-13 DIAGNOSIS — Z8659 Personal history of other mental and behavioral disorders: Secondary | ICD-10-CM | POA: Insufficient documentation

## 2013-08-13 MED ORDER — HYDROCODONE-ACETAMINOPHEN 5-325 MG PO TABS: 1.0000 | ORAL_TABLET | ORAL | Status: DC | PRN

## 2013-08-13 MED ORDER — PENICILLIN V POTASSIUM 500 MG PO TABS: 500.0000 mg | ORAL_TABLET | Freq: Three times a day (TID) | ORAL | Status: DC

## 2013-08-13 NOTE — ED Notes (Addendum)
Pt reports pain to rt upper tooth that radiates to rt side of the face and head.  Pt reports pain 10/10.  Pt states pain X3 days.  Pt states her face is swollen and she cants speak.   Pt alert oriented X4

## 2013-08-13 NOTE — ED Notes (Signed)
Reports broken right upper tooth and having pain, airway intact.

## 2013-08-13 NOTE — ED Notes (Signed)
Pt sucking on thumb.  RN asked why pt sucking on thumb and pt states that it helps her tooth

## 2013-08-13 NOTE — ED Provider Notes (Signed)
CSN: [id number]     Arrival date & time 08/13/13  1323 History  This chart was scribed for Chan Rosasco G. Neva Seat, PA, working with Joya Gaskins, MD, by Allene Dillon, ED Scribe. This patient was seen in room TR05C/TR05C and the patient's care was started at 1:48 PM.     Chief Complaint  Patient presents with  . Dental Pain   (Consider location/radiation/quality/duration/timing/severity/associated sxs/prior Treatment) The history is provided by the patient. No language interpreter was used.   HPI Comments:  Karina [name] is a 32 y.o. female with a significant PMH of hypertension  presents to the ER with a dental complaint. Symptoms began 3 days ago. The patient has tried to alleviate pain with a previous prescription of penicillin and goody powders with mild relief.  Pain rated at a 10/10, characterized as throbbing in nature and located right upper tooth. Pt states she has had dental pain for 2 years. Patient denies fever, night sweats, chills, difficulty swallowing or opening mouth, SOB, nuchal rigidity or decreased ROM of neck.  Patient does not have a dentist and requests a resource guide at discharge.   Past Medical History  Diagnosis Date  . Preterm labor   . Depression   . Chlamydia   . Viral meningitis   . Abnormal Pap smear   . Headache(784.0)   . UTI (lower urinary tract infection)   . Hypertension   . Pregnancy induced hypertension   . Infection   . IUFD (intrauterine fetal death) 2004-09-28    Term IUFD at 40 weeks, unknown cause    Past Surgical History  Procedure Laterality Date  . Elective abortion     Family History  Problem Relation Age of Onset  . Cancer Mother   . Diabetes Mother   . Hyperlipidemia Mother   . Depression Mother   . Hypertension Mother   . Mental illness Mother   . Arthritis Maternal Grandmother   . Diabetes Maternal Grandmother   . Stroke Maternal Grandmother   . Diabetes Maternal Grandfather   . Hypertension Maternal Grandfather     History  Substance Use Topics  . Smoking status: Current Some Day Smoker  . Smokeless tobacco: Never Used  . Alcohol Use: No     Comment: Pt smokes "weed" everyday   OB History   Grav Para Term Preterm Abortions TAB SAB Ect Mult Living   14 7 5 2 7 4 3  0 0 6     Review of Systems  Constitutional: Negative for chills.  HENT: Positive for dental problem. Negative for trouble swallowing.   Respiratory: Negative for shortness of breath.   All other systems reviewed and are negative.    Allergies  Latex  Home Medications   Current Outpatient Rx  Name  Route  Sig  Dispense  Refill  . Aspirin-Salicylamide-Caffeine (BC HEADACHE POWDER PO)   Oral   Take 1 packet by mouth daily as needed (pain).         Marland Kitchen penicillin v potassium (VEETID) 500 MG tablet   Oral   Take 1 tablet (500 mg total) by mouth 3 (three) times daily.   30 tablet   0   . HYDROcodone-acetaminophen (NORCO/VICODIN) 5-325 MG per tablet   Oral   Take 1 tablet by mouth every 4 (four) hours as needed for pain.   20 tablet   0   . penicillin v potassium (VEETID) 500 MG tablet   Oral   Take 1 tablet (500 mg total) by mouth  3 (three) times daily.   30 tablet   0    Triage Vitals: BP 140/90  Pulse 73  Temp(Src) 99 F (37.2 C) (Oral)  Resp 22  Ht 5\' 7"  (1.702 m)  Wt 170 lb (77.111 kg)  BMI 26.62 kg/m2  SpO2 100%  LMP 07/30/2013  Physical Exam  Nursing note and vitals reviewed. Constitutional: She is oriented to person, place, and time. She appears well-developed and well-nourished. No distress.  HENT:  Head: Normocephalic and atraumatic.  Right Ear: External ear normal.  Left Ear: External ear normal.  Nose: Nose normal.  Mouth/Throat: Uvula is midline, oropharynx is clear and moist and mucous membranes are normal. Normal dentition. Dental caries (Pts tooth shows no obvious abscess but moderate to severe tenderness to palpation of marked tooth) present. No edematous.    Eyes: Conjunctivae are  normal. Pupils are equal, round, and reactive to light.  Neck: Trachea normal, normal range of motion and full passive range of motion without pain. Neck supple.  Cardiovascular: Normal rate, regular rhythm, normal heart sounds and normal pulses.   Pulmonary/Chest: Effort normal and breath sounds normal. No respiratory distress. Chest wall is not dull to percussion. She exhibits no tenderness, no crepitus, no edema, no deformity and no retraction.  Abdominal: Normal appearance.  Musculoskeletal: Normal range of motion.  Neurological: She is alert and oriented to person, place, and time. She has normal strength.  Skin: Skin is warm, dry and intact. She is not diaphoretic.  Psychiatric: She has a normal mood and affect. Her speech is normal. Cognition and memory are normal.    ED Course  NERVE BLOCK Date/Time: 08/13/2013 2:05 PM Performed by: Dorthula Matas Authorized by: Dorthula Matas Consent: Verbal consent obtained. Risks and benefits: risks, benefits and alternatives were discussed Consent given by: patient Body area: face/mouth Laterality: right Outcome: pain improved   (including critical care time)  DIAGNOSTIC STUDIES: Oxygen Saturation is 100% on RA, normal by my interpretation.    COORDINATION OF CARE: 1:55 PM- Pt advised of plan for treatment which includes antibiotics, pain medication, nerve block procedure and pt agrees. Will provide excuse 3 day excuse from work.    Labs Review Labs Reviewed - No data to display Imaging Review No results found.  MDM   1. Toothache    Patient has dental pain. No emergent s/sx's present. Patent airway. No trismus.  Will be given pain medication and antibiotics. I discussed the need to call dentist within 24/48 hours for follow-up. Dental referral given. Return to ED precautions given.  Pt voiced understanding and has agreed to follow-up.   32 y.o.[name]'s evaluation in the Emergency Department is complete. It has  been determined that no acute conditions requiring further emergency intervention are present at this time. The patient/guardian have been advised of the diagnosis and plan. We have discussed signs and symptoms that warrant return to the ED, such as changes or worsening in symptoms.  Vital signs are stable at discharge. Filed Vitals:   08/13/13 1326  BP: 140/90  Pulse: 73  Temp: 99 F (37.2 C)  Resp: 22    Patient/guardian has voiced understanding and agreed to follow-up with the PCP or specialist.  I personally performed the services described in this documentation, which was scribed in my presence. The recorded information has been reviewed and is accurate.    Dorthula Matas, PA-C 08/13/13 1405

## 2013-08-14 NOTE — ED Provider Notes (Signed)
Medical screening examination/treatment/procedure(s) were performed by non-physician practitioner and as supervising physician I was immediately available for consultation/collaboration.   Joya Gaskins, MD 08/14/13 [phone]

## 2013-12-05 ENCOUNTER — Emergency Department (HOSPITAL_COMMUNITY)
Admission: EM | Admit: 2013-12-05 | Discharge: 2013-12-05 | Disposition: A | Discharge status: Home / Self Care | Payer: Medicaid Other | Attending: Emergency Medicine | Admitting: Emergency Medicine

## 2013-12-05 ENCOUNTER — Encounter (HOSPITAL_COMMUNITY): Payer: Self-pay | Admitting: Emergency Medicine

## 2013-12-05 DIAGNOSIS — Z87440 Personal history of urinary (tract) infections: Secondary | ICD-10-CM | POA: Insufficient documentation

## 2013-12-05 DIAGNOSIS — R197 Diarrhea, unspecified: Secondary | ICD-10-CM | POA: Insufficient documentation

## 2013-12-05 DIAGNOSIS — Z8661 Personal history of infections of the central nervous system: Secondary | ICD-10-CM | POA: Insufficient documentation

## 2013-12-05 DIAGNOSIS — Z792 Long term (current) use of antibiotics: Secondary | ICD-10-CM | POA: Insufficient documentation

## 2013-12-05 DIAGNOSIS — F17200 Nicotine dependence, unspecified, uncomplicated: Secondary | ICD-10-CM | POA: Insufficient documentation

## 2013-12-05 DIAGNOSIS — Z20818 Contact with and (suspected) exposure to other bacterial communicable diseases: Secondary | ICD-10-CM

## 2013-12-05 DIAGNOSIS — Z8751 Personal history of pre-term labor: Secondary | ICD-10-CM | POA: Insufficient documentation

## 2013-12-05 DIAGNOSIS — Z8619 Personal history of other infectious and parasitic diseases: Secondary | ICD-10-CM | POA: Insufficient documentation

## 2013-12-05 DIAGNOSIS — I10 Essential (primary) hypertension: Secondary | ICD-10-CM | POA: Insufficient documentation

## 2013-12-05 DIAGNOSIS — Z91040 Latex allergy status: Secondary | ICD-10-CM | POA: Insufficient documentation

## 2013-12-05 DIAGNOSIS — Z8659 Personal history of other mental and behavioral disorders: Secondary | ICD-10-CM | POA: Insufficient documentation

## 2013-12-05 DIAGNOSIS — J029 Acute pharyngitis, unspecified: Secondary | ICD-10-CM

## 2013-12-05 DIAGNOSIS — Z8742 Personal history of other diseases of the female genital tract: Secondary | ICD-10-CM | POA: Insufficient documentation

## 2013-12-05 DIAGNOSIS — Z2089 Contact with and (suspected) exposure to other communicable diseases: Secondary | ICD-10-CM | POA: Insufficient documentation

## 2013-12-05 MED ORDER — AMOXICILLIN-POT CLAVULANATE 875-125 MG PO TABS: 1.0000 | ORAL_TABLET | Freq: Two times a day (BID) | ORAL | Status: DC

## 2013-12-05 MED ORDER — AMOXICILLIN 875 MG PO TABS: 875.0000 mg | ORAL_TABLET | Freq: Two times a day (BID) | ORAL | Status: DC

## 2013-12-05 NOTE — Discharge Instructions (Signed)
Read the information below.  Use the prescribed medication as directed.  Please discuss all new medications with your pharmacist.  You may return to the Emergency Department at any time for worsening condition or any new symptoms that concern you.  If you develop high fevers, difficulty swallowing or breathing, or you are unable to tolerate fluids by mouth, return to the ER immediately for a recheck.      Infection Control in the Home These are general guidelines on infection control for family and friends providing care in the home. This information is not a substitute for instructions given by your doctor. HOW INFECTIONS ARE SPREAD In order for an infection to spread, the following must be present:  A germ - virus or bacteria.  A place for the germ to live - person, animal, plant, food, soil or water.  A susceptible host - a person who does not have resistance (immunity) to the germ.  A way for the germ to enter the host:  Direct contact - shaking hands, hugging, etc.  Indirect contact - when food, water, bandages or other substances contaminated by the germ enter the host.  Droplets - such as those produced by a sneeze or cough which can travel several feet in the air.  Tiny dust particles that travel long distances in the air.  Cover nose and mouth with a tissue when coughing or sneezing or cough into your elbow.  Dispose of used tissue in the nearest waste receptacle.  Clean hands with soap and water, an alcohol-based rub after touching respiratory secretions or handling contaminated objects. CLEANLINESS IS THE KEY!  Everyone must wash hands before handling or eating food, and after using the toilet, changing a diaper, touching pets, money, uncooked food, coughing, sneezing, blowing nose, touching nose, eyes or mouth.  Caregivers must wash hands before and after giving care: cleaning wounds or carrying for catheters, changing bandages or handling soiled linens, and giving mouth  care or cleaning private parts.  Wash hands properly - use lots of warm, running water and soap to lather hands and wrist. Scrub for at least 15 seconds including under the fingernails.  Rinse well with hands pointing down to allow germs to flow into sink. Dry with clean paper or cloth towel. Use lotion for dry skin.  Stock up on cleaning supplies: disinfectants, separate sponges for bathroom and kitchen, paper towels, utility gloves (replace when cracked, torn or start to peel).  Use bleach safely - never mix with other cleaning products!  Take care of your cleaning supplies - toilet brushes, mops and sponges can breed germs. Soak them in bleach and water for 5 minutes after each use. HOME CARE INSTRUCTIONS  Provide good ventilation in the home.  Be cautious with pet care - do not let those with weakened immune systems touch bird dropping, fish tank water or cat feces (change litter daily). Bathroom  Provide liquid soap - bar soap can become a home for bacteria.  Change towels and washcloths daily to prevent possible fungal infections.  Change toothbrushes often and store them separately in a clean dry place.  Disinfect the toilet.  Clean the tub, shower and sink with standard cleaning products.  Mop the floor once a week with a standard cleaner.  Do not share personal items such as razors, toothbrushes, glasses, combs, brushes, towels and washcloths. Kitchen  Store food carefully: refrigerate leftovers promptly in covered containers. Throw out stale or spoiled food. Clean the inside of refrigerator weekly. Keep refrigerator  set at 51F or less and freezer at [facility]89F or less. Thaw foods in the refrigerator or microwave. Serve foods at proper temperature.  Take care with meat: never eat raw. Make sure it cooked to the appropriate temperature and cook eggs until they are firm.  Wash fruits and vegetables under running water.  Use separate cutting boards, plates and utensils for raw  and cooked foods.  Keep work surfaces clean.  Use a clean spoon each time you sample food while cooking.  Wash dishes and utensils in hot soapy water, air dry or use a dishwasher.  Do not share forks, cups or spoons during meals.  Do not pour used mop water down the sink. Pour it down the toilet instead. Doing laundry  Wear gloves if laundry is visibly soiled.  Change linens once a week or when soiled.  Do not shake soiled linens - you may send germs into the air. Dispose of contaminated wastes  Place dressings, sanitary or incontinence pads, diapers, or gloves in plastic garbage bags for disposal. KNOW THE SIGNS & SYMPTOMS OF INFECTION  Inflamed skin-skin that is red, hot, swollen or has a rash.  Fever or chills.  Pus-green or yellow drainage from a wound.  Nausea or vomiting.  Persistent diarrhea.  Cough or sore throat.  Painful urination. Document Released: 08/07/2008 Document Revised: 01/21/2012 Document Reviewed: 08/07/2008 [facility] Patient Information 2014 [place], [place].

## 2013-12-05 NOTE — ED Provider Notes (Signed)
CSN: [id number]     Arrival date & time 12/05/13  4098 History   First MD Initiated Contact with Patient 12/05/13 [phone]     Chief Complaint  Patient presents with  . Sore Throat   (Consider location/radiation/quality/duration/timing/severity/associated sxs/prior Treatment) HPI Patient presents with 3 days of right ear pain, sore throat, diarrhea. She has a daughter who is sick at home with diagnosis to strep throat by culture. She has been sharing drinks and food with her. She denies cough, shortness of breath, abdominal pain, vomiting, fevers.  Past Medical History  Diagnosis Date  . Preterm labor   . Depression   . Chlamydia   . Viral meningitis   . Abnormal Pap smear   . Headache(784.0)   . UTI (lower urinary tract infection)   . Hypertension   . Pregnancy induced hypertension   . Infection   . IUFD (intrauterine fetal death) Sep 27, 2004    Term IUFD at 40 weeks, unknown cause    Past Surgical History  Procedure Laterality Date  . Elective abortion     Family History  Problem Relation Age of Onset  . Cancer Mother   . Diabetes Mother   . Hyperlipidemia Mother   . Depression Mother   . Hypertension Mother   . Mental illness Mother   . Arthritis Maternal Grandmother   . Diabetes Maternal Grandmother   . Stroke Maternal Grandmother   . Diabetes Maternal Grandfather   . Hypertension Maternal Grandfather    History  Substance Use Topics  . Smoking status: Current Some Day Smoker  . Smokeless tobacco: Never Used  . Alcohol Use: No     Comment: Pt smokes "weed" everyday   OB History   Grav Para Term Preterm Abortions TAB SAB Ect Mult Living   14 7 5 2 7 4 3  0 0 6     Review of Systems  Constitutional: Negative for fever, chills, activity change and appetite change.  HENT: Positive for sore throat. Negative for trouble swallowing.   Respiratory: Negative for cough and shortness of breath.   Cardiovascular: Negative for chest pain.  Gastrointestinal: Positive for  diarrhea. Negative for nausea, vomiting and abdominal pain.    Allergies  Latex  Home Medications   Current Outpatient Rx  Name  Route  Sig  Dispense  Refill  . amoxicillin (AMOXIL) 875 MG tablet   Oral   Take 1 tablet (875 mg total) by mouth 2 (two) times daily.   20 tablet   0    BP 129/94  Pulse 70  Temp(Src) 97.9 F (36.6 C) (Oral)  Resp 18  SpO2 100% Physical Exam  Nursing note and vitals reviewed. Constitutional: She appears well-developed and well-nourished. No distress.  HENT:  Head: Normocephalic and atraumatic.  Mouth/Throat: Uvula is midline. Mucous membranes are not dry. No uvula swelling. Posterior oropharyngeal erythema present. No oropharyngeal exudate, posterior oropharyngeal edema or tonsillar abscesses.  Eyes: Conjunctivae are normal.  Neck: Neck supple.  Cardiovascular: Normal rate and regular rhythm.   Pulmonary/Chest: Effort normal and breath sounds normal. No stridor. No respiratory distress. She has no wheezes. She has no rales.  Lymphadenopathy:    She has cervical adenopathy.  Neurological: She is alert.  Skin: She is not diaphoretic.    ED Course  Procedures (including critical care time) Labs Review Labs Reviewed - No data to display Imaging Review No results found.  EKG Interpretation   None       MDM   1. Pharyngitis  2. Exposure to strep throat    Patient presents with sore throat, cough, diarrhea and exposure to close contact (daughter ) with strep throat (diagnosed by culture).  Exam is unremarkable. Discussed risks and benefits of empiric treatment with anabiotic versus testing. Patient requests treatment with antibiotics. Discharged home with amoxicillin. Primary care followup as needed. Discussed result, findings, treatment, and follow up  with patient.  Pt given return precautions.  Pt verbalizes understanding and agrees with plan.        ReynoldsEmily Tuan Tippin, PA-C 12/05/13 [phone]

## 2013-12-05 NOTE — ED Notes (Signed)
C/o cough, sore thoat X2d, no F/V/D, no meds pta, A/O X4, ambulatory and in NAD

## 2013-12-11 NOTE — ED Provider Notes (Signed)
Medical screening examination/treatment/procedure(s) were performed by non-physician practitioner and as supervising physician I was immediately available for consultation/collaboration.  Javohn Basey T Tamyrah Burbage, MD 12/11/13 0934 

## 2014-04-16 ENCOUNTER — Emergency Department (HOSPITAL_COMMUNITY)
Admission: EM | Admit: 2014-04-16 | Discharge: 2014-04-16 | Disposition: A | Discharge status: Home / Self Care | Payer: Medicaid Other | Attending: Emergency Medicine | Admitting: Emergency Medicine

## 2014-04-16 ENCOUNTER — Encounter (HOSPITAL_COMMUNITY): Payer: Self-pay | Admitting: Emergency Medicine

## 2014-04-16 DIAGNOSIS — K089 Disorder of teeth and supporting structures, unspecified: Secondary | ICD-10-CM | POA: Insufficient documentation

## 2014-04-16 DIAGNOSIS — K007 Teething syndrome: Secondary | ICD-10-CM | POA: Diagnosis not present

## 2014-04-16 DIAGNOSIS — I10 Essential (primary) hypertension: Secondary | ICD-10-CM | POA: Diagnosis not present

## 2014-04-16 DIAGNOSIS — Z87440 Personal history of urinary (tract) infections: Secondary | ICD-10-CM | POA: Insufficient documentation

## 2014-04-16 DIAGNOSIS — K0381 Cracked tooth: Secondary | ICD-10-CM | POA: Insufficient documentation

## 2014-04-16 DIAGNOSIS — Z8659 Personal history of other mental and behavioral disorders: Secondary | ICD-10-CM | POA: Diagnosis not present

## 2014-04-16 DIAGNOSIS — Z87898 Personal history of other specified conditions: Secondary | ICD-10-CM | POA: Diagnosis not present

## 2014-04-16 DIAGNOSIS — Z8768 Personal history of other (corrected) conditions arising in the perinatal period: Secondary | ICD-10-CM | POA: Diagnosis not present

## 2014-04-16 DIAGNOSIS — F17200 Nicotine dependence, unspecified, uncomplicated: Secondary | ICD-10-CM | POA: Insufficient documentation

## 2014-04-16 DIAGNOSIS — Z8619 Personal history of other infectious and parasitic diseases: Secondary | ICD-10-CM | POA: Diagnosis not present

## 2014-04-16 DIAGNOSIS — Z91040 Latex allergy status: Secondary | ICD-10-CM | POA: Insufficient documentation

## 2014-04-16 DIAGNOSIS — K0889 Other specified disorders of teeth and supporting structures: Secondary | ICD-10-CM

## 2014-04-16 MED ORDER — HYDROCODONE-ACETAMINOPHEN 5-325 MG PO TABS: 1.0000 | ORAL_TABLET | Freq: Four times a day (QID) | ORAL | Status: DC | PRN

## 2014-04-16 MED ORDER — PENICILLIN V POTASSIUM 500 MG PO TABS: 500.0000 mg | ORAL_TABLET | Freq: Four times a day (QID) | ORAL | Status: AC

## 2014-04-16 MED ORDER — IBUPROFEN 600 MG PO TABS: 600.0000 mg | ORAL_TABLET | Freq: Four times a day (QID) | ORAL | Status: DC | PRN

## 2014-04-16 MED ORDER — HYDROCODONE-ACETAMINOPHEN 5-325 MG PO TABS
1.0000 | ORAL_TABLET | Freq: Once | ORAL | Status: AC
  Administered 2014-04-16: 1 via ORAL
  Filled 2014-04-16: qty 1

## 2014-04-16 NOTE — ED Notes (Signed)
Patient with dental pain on left lower jaw.  Patient states that it is two teeth on the bottom.  Patient states they are broken.  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

## 2014-04-16 NOTE — Discharge Instructions (Signed)
Dental Pain Take medications as prescribed.  Do not take more BC powders than recommended on the packaging.  A tooth ache may be caused by cavities (tooth decay). Cavities expose the nerve of the tooth to air and hot or cold temperatures. It may come from an infection or abscess (also called a boil or furuncle) around your tooth. It is also often caused by dental caries (tooth decay). This causes the pain you are having. DIAGNOSIS  Your caregiver can diagnose this problem by exam. TREATMENT   If caused by an infection, it may be treated with medications which kill germs (antibiotics) and pain medications as prescribed by your caregiver. Take medications as directed.  Only take over-the-counter or prescription medicines for pain, discomfort, or fever as directed by your caregiver.  Whether the tooth ache today is caused by infection or dental disease, you should see your dentist as soon as possible for further care. SEEK MEDICAL CARE IF: The exam and treatment you received today has been provided on an emergency basis only. This is not a substitute for complete medical or dental care. If your problem worsens or new problems (symptoms) appear, and you are unable to meet with your dentist, call or return to this location. SEEK IMMEDIATE MEDICAL CARE IF:   You have a fever.  You develop redness and swelling of your face, jaw, or neck.  You are unable to open your mouth.  You have severe pain uncontrolled by pain medicine. MAKE SURE YOU:   Understand these instructions.  Will watch your condition.  Will get help right away if you are not doing well or get worse. Document Released: 10/29/2005 Document Revised: 01/21/2012 Document Reviewed: 06/16/2008 [facility] Patient Information 2014 [place], [place].

## 2014-04-16 NOTE — ED Notes (Signed)
Patient has multiple damaged decaying teeth on left lower side of mouth. States that she has seen a dentist but couldn't afford all the extractions that she needed at that time.

## 2014-04-16 NOTE — ED Provider Notes (Signed)
CSN: [id number]     Arrival date & time 04/16/14  0344 History   First MD Initiated Contact with Patient 04/16/14 [phone]     Chief Complaint  Patient presents with  . Dental Pain     (Consider location/radiation/quality/duration/timing/severity/associated sxs/prior Treatment) HPI  This is a 33 year old female who presents with dental pain. Patient reports onset of symptoms at 9:30 PM last night. She is taking BC powders at home without relief. History of poor dentition multiple teeth that have had to be pulled. She denies any fevers, difficulty swallowing, or significant swelling. Currently her pain is 8/10.  Past Medical History  Diagnosis Date  . Preterm labor   . Depression   . Chlamydia   . Viral meningitis   . Abnormal Pap smear   . Headache(784.0)   . UTI (lower urinary tract infection)   . Hypertension   . Pregnancy induced hypertension   . Infection   . IUFD (intrauterine fetal death) 09/06/2004    Term IUFD at 40 weeks, unknown cause    Past Surgical History  Procedure Laterality Date  . Elective abortion     Family History  Problem Relation Age of Onset  . Cancer Mother   . Diabetes Mother   . Hyperlipidemia Mother   . Depression Mother   . Hypertension Mother   . Mental illness Mother   . Arthritis Maternal Grandmother   . Diabetes Maternal Grandmother   . Stroke Maternal Grandmother   . Diabetes Maternal Grandfather   . Hypertension Maternal Grandfather    History  Substance Use Topics  . Smoking status: Current Some Day Smoker  . Smokeless tobacco: Never Used  . Alcohol Use: No     Comment: Pt smokes "weed" everyday   OB History   Grav Para Term Preterm Abortions TAB SAB Ect Mult Living   14 7 5 2 7 4 3  0 0 6     Review of Systems  Constitutional: Negative for fever.  HENT: Positive for dental problem. Negative for sore throat, trouble swallowing and voice change.   All other systems reviewed and are negative.     Allergies  Latex  Home  Medications   Prior to Admission medications   Medication Sig Start Date End Date Taking? Authorizing Provider  Aspirin-Caffeine 845-65 MG PACK Take 5 Packages by mouth once.   Yes Historical Provider, MD  HYDROcodone-acetaminophen (NORCO/VICODIN) 5-325 MG per tablet Take 1 tablet by mouth every 6 (six) hours as needed for moderate pain or severe pain. 04/16/14   Shon Batonourtney F Baley Lorimer, MD  ibuprofen (ADVIL,MOTRIN) 600 MG tablet Take 1 tablet (600 mg total) by mouth every 6 (six) hours as needed. 04/16/14   Shon Batonourtney F Dwayne Bulkley, MD  penicillin v potassium (VEETID) 500 MG tablet Take 1 tablet (500 mg total) by mouth 4 (four) times daily. 04/16/14 04/23/14  Shon Batonourtney F Malini Flemings, MD   BP 178/120  Pulse 65  Temp(Src) 97.7 F (36.5 C) (Oral)  Resp 18  SpO2 100%  LMP 03/21/2014 Physical Exam  Nursing note and vitals reviewed. Constitutional: She is oriented to person, place, and time. She appears well-developed and well-nourished.  HENT:  Head: Normocephalic and atraumatic.  Generally poor dentition, multiple broken teeth, dry socket noted left premolar, no obvious abscess, no trismus or submandibular swelling  Cardiovascular: Normal rate and regular rhythm.   Pulmonary/Chest: Effort normal. No respiratory distress.  Neurological: She is alert and oriented to person, place, and time.  Skin: Skin is warm and dry.  Psychiatric: She has a normal mood and affect.    ED Course  Procedures (including critical care time) Labs Review Labs Reviewed - No data to display  Imaging Review No results found.   EKG Interpretation None      MDM   Final diagnoses:  Pain, dental    Patient presents with dental pain. Patient reports that she follows up at least family dentistry. She is nontoxic on exam. No evidence of lower legs. No evidence of abscess. Patient was given pain medication. She will be discharged with pain medication and penicillin. She is to followup with her dentist as soon as possible.  After  history, exam, and medical workup I feel the patient has been appropriately medically screened and is safe for discharge home. Pertinent diagnoses were discussed with the patient. Patient was given return precautions.     Shon Baton, MD 04/16/14 [phone]

## 2014-05-11 ENCOUNTER — Emergency Department (HOSPITAL_COMMUNITY): Payer: Medicaid Other

## 2014-05-11 ENCOUNTER — Emergency Department (HOSPITAL_COMMUNITY)
Admission: EM | Admit: 2014-05-11 | Discharge: 2014-05-11 | Disposition: A | Discharge status: Home / Self Care | Payer: Medicaid Other | Attending: Emergency Medicine | Admitting: Emergency Medicine

## 2014-05-11 ENCOUNTER — Encounter (HOSPITAL_COMMUNITY): Payer: Self-pay | Admitting: Emergency Medicine

## 2014-05-11 DIAGNOSIS — O169 Unspecified maternal hypertension, unspecified trimester: Secondary | ICD-10-CM | POA: Insufficient documentation

## 2014-05-11 DIAGNOSIS — Z8619 Personal history of other infectious and parasitic diseases: Secondary | ICD-10-CM | POA: Insufficient documentation

## 2014-05-11 DIAGNOSIS — Z8659 Personal history of other mental and behavioral disorders: Secondary | ICD-10-CM | POA: Insufficient documentation

## 2014-05-11 DIAGNOSIS — N831 Corpus luteum cyst of ovary, unspecified side: Secondary | ICD-10-CM | POA: Insufficient documentation

## 2014-05-11 DIAGNOSIS — Z87440 Personal history of urinary (tract) infections: Secondary | ICD-10-CM | POA: Insufficient documentation

## 2014-05-11 DIAGNOSIS — O9989 Other specified diseases and conditions complicating pregnancy, childbirth and the puerperium: Secondary | ICD-10-CM | POA: Insufficient documentation

## 2014-05-11 DIAGNOSIS — R112 Nausea with vomiting, unspecified: Secondary | ICD-10-CM

## 2014-05-11 DIAGNOSIS — O210 Mild hyperemesis gravidarum: Secondary | ICD-10-CM | POA: Insufficient documentation

## 2014-05-11 DIAGNOSIS — Z9889 Other specified postprocedural states: Secondary | ICD-10-CM | POA: Diagnosis not present

## 2014-05-11 DIAGNOSIS — Z3490 Encounter for supervision of normal pregnancy, unspecified, unspecified trimester: Secondary | ICD-10-CM

## 2014-05-11 DIAGNOSIS — Z91040 Latex allergy status: Secondary | ICD-10-CM | POA: Insufficient documentation

## 2014-05-11 DIAGNOSIS — IMO0002 Reserved for concepts with insufficient information to code with codable children: Secondary | ICD-10-CM | POA: Insufficient documentation

## 2014-05-11 LAB — POC URINE PREG, ED: Preg Test, Ur: POSITIVE — AB

## 2014-05-11 LAB — URINALYSIS, ROUTINE W REFLEX MICROSCOPIC
Bilirubin Urine: NEGATIVE
GLUCOSE, UA: NEGATIVE mg/dL
HGB URINE DIPSTICK: NEGATIVE
Ketones, ur: >80 mg/dL — AB
Leukocytes, UA: NEGATIVE
Nitrite: NEGATIVE
PH: 7 (ref 5.0–8.0)
Protein, ur: 100 mg/dL — AB
SPECIFIC GRAVITY, URINE: 1.036 — AB (ref 1.005–1.030)
Urobilinogen, UA: 0.2 mg/dL (ref 0.0–1.0)

## 2014-05-11 LAB — CBC WITH DIFFERENTIAL/PLATELET
BASOS PCT: 0 % (ref 0–1)
Basophils Absolute: 0 10*3/uL (ref 0.0–0.1)
Basophils Absolute: 0 10*3/uL (ref 0.0–0.1)
Basophils Relative: 0 % (ref 0–1)
EOS ABS: 0 10*3/uL (ref 0.0–0.7)
EOS PCT: 0 % (ref 0–5)
Eosinophils Absolute: 0 10*3/uL (ref 0.0–0.7)
Eosinophils Relative: 0 % (ref 0–5)
HCT: 36.8 % (ref 36.0–46.0)
HEMATOCRIT: 36.4 % (ref 36.0–46.0)
HEMOGLOBIN: 12.3 g/dL (ref 12.0–15.0)
Hemoglobin: 12.3 g/dL (ref 12.0–15.0)
LYMPHS ABS: 1.3 10*3/uL (ref 0.7–4.0)
LYMPHS PCT: 14 % (ref 12–46)
Lymphocytes Relative: 13 % (ref 12–46)
Lymphs Abs: 1.1 10*3/uL (ref 0.7–4.0)
MCH: 28.7 pg (ref 26.0–34.0)
MCH: 28.7 pg (ref 26.0–34.0)
MCHC: 33.4 g/dL (ref 30.0–36.0)
MCHC: 33.8 g/dL (ref 30.0–36.0)
MCV: 85 fL (ref 78.0–100.0)
MCV: 86 fL (ref 78.0–100.0)
MONO ABS: 0.5 10*3/uL (ref 0.1–1.0)
MONOS PCT: 5 % (ref 3–12)
Monocytes Absolute: 0.4 10*3/uL (ref 0.1–1.0)
Monocytes Relative: 6 % (ref 3–12)
NEUTROS ABS: 7 10*3/uL (ref 1.7–7.7)
NEUTROS ABS: 7.5 10*3/uL (ref 1.7–7.7)
NEUTROS PCT: 82 % — AB (ref 43–77)
Neutrophils Relative %: 80 % — ABNORMAL HIGH (ref 43–77)
Platelets: 354 10*3/uL (ref 150–400)
Platelets: 342 10*3/uL (ref 150–400)
RBC: 4.28 MIL/uL (ref 3.87–5.11)
RBC: 4.28 MIL/uL (ref 3.87–5.11)
RDW: 13.4 % (ref 11.5–15.5)
RDW: 13.5 % (ref 11.5–15.5)
WBC: 8.8 10*3/uL (ref 4.0–10.5)
WBC: 9.1 10*3/uL (ref 4.0–10.5)

## 2014-05-11 LAB — COMPREHENSIVE METABOLIC PANEL
ALT: 8 U/L (ref 0–35)
AST: 14 U/L (ref 0–37)
Albumin: 4.4 g/dL (ref 3.5–5.2)
Alkaline Phosphatase: 48 U/L (ref 39–117)
BILIRUBIN TOTAL: 0.4 mg/dL (ref 0.3–1.2)
BUN: 9 mg/dL (ref 6–23)
CALCIUM: 9.8 mg/dL (ref 8.4–10.5)
CHLORIDE: 99 meq/L (ref 96–112)
CO2: 23 meq/L (ref 19–32)
CREATININE: 0.7 mg/dL (ref 0.50–1.10)
GLUCOSE: 114 mg/dL — AB (ref 70–99)
Potassium: 3.6 mEq/L — ABNORMAL LOW (ref 3.7–5.3)
SODIUM: 140 meq/L (ref 137–147)
Total Protein: 7.9 g/dL (ref 6.0–8.3)

## 2014-05-11 LAB — URINE MICROSCOPIC-ADD ON

## 2014-05-11 LAB — ABO/RH: ABO/RH(D): B POS

## 2014-05-11 LAB — HCG, QUANTITATIVE, PREGNANCY: HCG, BETA CHAIN, QUANT, S: 37890 m[IU]/mL — AB (ref <5)

## 2014-05-11 MED ORDER — PROMETHAZINE HCL 25 MG/ML IJ SOLN
12.5000 mg | Freq: Once | INTRAMUSCULAR | Status: AC
  Administered 2014-05-11: 12.5 mg via INTRAVENOUS
  Filled 2014-05-11: qty 1

## 2014-05-11 MED ORDER — MORPHINE SULFATE 4 MG/ML IJ SOLN
4.0000 mg | INTRAMUSCULAR | Status: DC | PRN
  Administered 2014-05-11 (×2): 4 mg via INTRAVENOUS
  Filled 2014-05-11 (×2): qty 1

## 2014-05-11 MED ORDER — PROMETHAZINE HCL 25 MG PO TABS: 25.0000 mg | ORAL_TABLET | Freq: Four times a day (QID) | ORAL | Status: DC | PRN

## 2014-05-11 NOTE — ED Notes (Signed)
Patient transported to Ultrasound 

## 2014-05-11 NOTE — ED Notes (Signed)
Dr. James at the bedside.  

## 2014-05-11 NOTE — ED Provider Notes (Signed)
CSN: [id number]     Arrival date & time 05/11/14  1754 History   First MD Initiated Contact with Patient 05/11/14 1838     Chief Complaint  Patient presents with  . Abdominal Pain      HPI  Patient presents with left lower quadrant abdominal pain. Has a positive test at home. She been nauseated vomiting for last 2 days. The pain started suddenly this morning. After an episode of vomiting. No vaginal bleeding or discharge. Has not seen an OB/GYN as yet. Last was. Was "sometime in May". G 7.  Past Medical History  Diagnosis Date  . Preterm labor   . Depression   . Chlamydia   . Viral meningitis   . Abnormal Pap smear   . Headache(784.0)   . UTI (lower urinary tract infection)   . Hypertension   . Pregnancy induced hypertension   . Infection   . IUFD (intrauterine fetal death) 09/06/2004    Term IUFD at 40 weeks, unknown cause    Past Surgical History  Procedure Laterality Date  . Elective abortion     Family History  Problem Relation Age of Onset  . Cancer Mother   . Diabetes Mother   . Hyperlipidemia Mother   . Depression Mother   . Hypertension Mother   . Mental illness Mother   . Arthritis Maternal Grandmother   . Diabetes Maternal Grandmother   . Stroke Maternal Grandmother   . Diabetes Maternal Grandfather   . Hypertension Maternal Grandfather    History  Substance Use Topics  . Smoking status: Current Some Day Smoker  . Smokeless tobacco: Never Used  . Alcohol Use: No     Comment: Pt smokes "weed" everyday   OB History   Grav Para Term Preterm Abortions TAB SAB Ect Mult Living   15 7 5 2 7 4 3  0 0 6     Review of Systems  Constitutional: Negative for fever, chills, diaphoresis, appetite change and fatigue.  HENT: Negative for mouth sores, sore throat and trouble swallowing.   Eyes: Negative for visual disturbance.  Respiratory: Negative for cough, chest tightness, shortness of breath and wheezing.   Cardiovascular: Negative for chest pain.    Gastrointestinal: Positive for nausea and abdominal pain. Negative for vomiting, diarrhea and abdominal distention.  Endocrine: Negative for polydipsia, polyphagia and polyuria.  Genitourinary: Negative for dysuria, frequency and hematuria.  Musculoskeletal: Negative for gait problem.  Skin: Negative for color change, pallor and rash.  Neurological: Negative for dizziness, syncope, light-headedness and headaches.  Hematological: Does not bruise/bleed easily.  Psychiatric/Behavioral: Negative for behavioral problems and confusion.      Allergies  Latex  Home Medications   Prior to Admission medications   Medication Sig Start Date End Date Taking? Authorizing Provider  promethazine (PHENERGAN) 25 MG tablet Take 1 tablet (25 mg total) by mouth every 6 (six) hours as needed for nausea or vomiting. 05/11/14   Rolland PorterMark James, MD   BP 134/78  Pulse 61  Temp(Src) 98.2 F (36.8 C) (Oral)  Resp 15  SpO2 97%  LMP 03/21/2014 Physical Exam  Constitutional: She is oriented to person, place, and time. She appears well-developed and well-nourished. No distress.  HENT:  Head: Normocephalic.  Eyes: Conjunctivae are normal. Pupils are equal, round, and reactive to light. No scleral icterus.  Neck: Normal range of motion. Neck supple. No thyromegaly present.  Cardiovascular: Normal rate and regular rhythm.  Exam reveals no gallop and no friction rub.   No murmur  heard. Pulmonary/Chest: Effort normal and breath sounds normal. No respiratory distress. She has no wheezes. She has no rales.  Abdominal: Soft. Bowel sounds are normal. She exhibits no distension. There is no tenderness. There is no rebound.    Musculoskeletal: Normal range of motion.  Neurological: She is alert and oriented to person, place, and time.  Skin: Skin is warm and dry. No rash noted.  Psychiatric: She has a normal mood and affect. Her behavior is normal.    ED Course  Procedures (including critical care time) Labs  Review Labs Reviewed  URINALYSIS, ROUTINE W REFLEX MICROSCOPIC - Abnormal; Notable for the following:    APPearance CLOUDY (*)    Specific Gravity, Urine 1.036 (*)    Ketones, ur >80 (*)    Protein, ur 100 (*)    All other components within normal limits  CBC WITH DIFFERENTIAL - Abnormal; Notable for the following:    Neutrophils Relative % 80 (*)    All other components within normal limits  COMPREHENSIVE METABOLIC PANEL - Abnormal; Notable for the following:    Potassium 3.6 (*)    Glucose, Bld 114 (*)    All other components within normal limits  CBC WITH DIFFERENTIAL - Abnormal; Notable for the following:    Neutrophils Relative % 82 (*)    All other components within normal limits  HCG, QUANTITATIVE, PREGNANCY - Abnormal; Notable for the following:    hCG, Beta Chain, Quant, S 16109 (*)    All other components within normal limits  URINE MICROSCOPIC-ADD ON - Abnormal; Notable for the following:    Squamous Epithelial / LPF FEW (*)    Bacteria, UA FEW (*)    All other components within normal limits  POC URINE PREG, ED - Abnormal; Notable for the following:    Preg Test, Ur POSITIVE (*)    All other components within normal limits  ABO/RH    Imaging Review US Ob Comp Less 14 Wks  05/11/2014   CLINICAL DATA:  Left-sided abdominal pain with nausea and vomiting. First trimester pregnancy. LMP 03/30/2014.  EXAM: OBSTETRIC <14 WK [place] AND TRANSVAGINAL OB US  TECHNIQUE: Both transabdominal and transvaginal ultrasound examinations were performed for complete evaluation of the gestation as well as the maternal uterus, adnexal regions, and pelvic cul-de-sac. Transvaginal technique was performed to assess early pregnancy.  COMPARISON:  None.  FINDINGS: Intrauterine gestational sac: Visualized/normal in shape.  Yolk sac:  Visualized.  Embryo:  Visualized.  Cardiac Activity: Visualized.  Heart Rate:  118 bpm  CRL:   3.8  mm   6 w 0 d                  [place] EDC: 01/04/2015  Maternal  uterus/adnexae: There is no significant subchorionic hematoma. Both maternal ovaries appear normal. Trace free pelvic fluid is noted asymmetric to the left.  IMPRESSION: Single live intrauterine gestation with best estimated gestational age of [redacted] weeks 0 days. No acute findings demonstrated.   Electronically Signed   By: Roxy Horseman M.D.   On: 05/11/2014 20:30   US Ob Transvaginal  05/11/2014   CLINICAL DATA:  Left-sided abdominal pain with nausea and vomiting. First trimester pregnancy. LMP 03/30/2014.  EXAM: OBSTETRIC <14 WK [place] AND TRANSVAGINAL OB US  TECHNIQUE: Both transabdominal and transvaginal ultrasound examinations were performed for complete evaluation of the gestation as well as the maternal uterus, adnexal regions, and pelvic cul-de-sac. Transvaginal technique was performed to assess early pregnancy.  COMPARISON:  None.  FINDINGS: Intrauterine gestational sac: Visualized/normal in shape.  Yolk sac:  Visualized.  Embryo:  Visualized.  Cardiac Activity: Visualized.  Heart Rate:  118 bpm  CRL:   3.8  mm   6 w 0 d                  [place] EDC: 01/04/2015  Maternal uterus/adnexae: There is no significant subchorionic hematoma. Both maternal ovaries appear normal. Trace free pelvic fluid is noted asymmetric to the left.  IMPRESSION: Single live intrauterine gestation with best estimated gestational age of [redacted] weeks 0 days. No acute findings demonstrated.   Electronically Signed   By: Roxy HorsemanBill  Veazey M.D.   On: 05/11/2014 20:30     EKG Interpretation None      MDM   Final diagnoses:  Pregnancy  Corpus luteum cyst rupture  Non-intractable vomiting with nausea, vomiting of unspecified type    Ultrasound shows normal ovaries. No corpus luteum noted. Intrauterine pregnancy with fetal heart tones. No subchorionic hemorrhage.  Fashion: This may be a rupture of her corpus luteum and she has some fluid in her left adnexa. Her exam remains benign. Is not colicky or intermittent it does not suggest ureteral  colic. Pain is improved after one dose IV pain meds no additional vomiting. Is discharge home. Follow for pain. Negative for nausea. OB/GYN followup.    Rolland PorterMark James, MD 05/11/14 2140
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
## 2014-05-11 NOTE — ED Notes (Signed)
Presents with nausea, vomiting and diarrhea began 3 days ago-pt reports she is 4-[redacted] weeks pregnant DX by home pregnancy test. Denies vaginal discharge or bleeding. Endorses left side lower abdominal pain. Symptoms include weakness and fatigue.

## 2014-05-12 ENCOUNTER — Inpatient Hospital Stay (HOSPITAL_COMMUNITY): Payer: Medicaid Other

## 2014-05-12 ENCOUNTER — Encounter (HOSPITAL_COMMUNITY): Payer: Self-pay | Admitting: Emergency Medicine

## 2014-05-12 ENCOUNTER — Encounter (HOSPITAL_COMMUNITY): Payer: Self-pay

## 2014-05-12 ENCOUNTER — Inpatient Hospital Stay (HOSPITAL_COMMUNITY)
Admission: AD | Admit: 2014-05-12 | Discharge: 2014-05-13 | Disposition: A | Discharge status: Home / Self Care | Payer: Medicaid Other | Source: Ambulatory Visit | Attending: Emergency Medicine | Admitting: Emergency Medicine

## 2014-05-12 ENCOUNTER — Emergency Department (HOSPITAL_COMMUNITY)
Admission: EM | Admit: 2014-05-12 | Discharge: 2014-05-12 | Disposition: A | Discharge status: Home / Self Care | Payer: Medicaid Other

## 2014-05-12 DIAGNOSIS — F1210 Cannabis abuse, uncomplicated: Secondary | ICD-10-CM | POA: Diagnosis not present

## 2014-05-12 DIAGNOSIS — R51 Headache: Secondary | ICD-10-CM | POA: Diagnosis not present

## 2014-05-12 DIAGNOSIS — R29898 Other symptoms and signs involving the musculoskeletal system: Secondary | ICD-10-CM | POA: Diagnosis not present

## 2014-05-12 DIAGNOSIS — R109 Unspecified abdominal pain: Secondary | ICD-10-CM | POA: Diagnosis present

## 2014-05-12 DIAGNOSIS — O99891 Other specified diseases and conditions complicating pregnancy: Secondary | ICD-10-CM | POA: Diagnosis not present

## 2014-05-12 DIAGNOSIS — H547 Unspecified visual loss: Secondary | ICD-10-CM

## 2014-05-12 DIAGNOSIS — O210 Mild hyperemesis gravidarum: Secondary | ICD-10-CM | POA: Insufficient documentation

## 2014-05-12 DIAGNOSIS — O139 Gestational [pregnancy-induced] hypertension without significant proteinuria, unspecified trimester: Secondary | ICD-10-CM

## 2014-05-12 DIAGNOSIS — H543 Unqualified visual loss, both eyes: Secondary | ICD-10-CM | POA: Insufficient documentation

## 2014-05-12 DIAGNOSIS — H53139 Sudden visual loss, unspecified eye: Secondary | ICD-10-CM

## 2014-05-12 DIAGNOSIS — R1031 Right lower quadrant pain: Secondary | ICD-10-CM | POA: Insufficient documentation

## 2014-05-12 DIAGNOSIS — O9989 Other specified diseases and conditions complicating pregnancy, childbirth and the puerperium: Principal | ICD-10-CM

## 2014-05-12 DIAGNOSIS — O10019 Pre-existing essential hypertension complicating pregnancy, unspecified trimester: Secondary | ICD-10-CM | POA: Insufficient documentation

## 2014-05-12 DIAGNOSIS — O99340 Other mental disorders complicating pregnancy, unspecified trimester: Secondary | ICD-10-CM | POA: Insufficient documentation

## 2014-05-12 DIAGNOSIS — R1032 Left lower quadrant pain: Secondary | ICD-10-CM

## 2014-05-12 DIAGNOSIS — O99330 Smoking (tobacco) complicating pregnancy, unspecified trimester: Secondary | ICD-10-CM | POA: Insufficient documentation

## 2014-05-12 DIAGNOSIS — O26899 Other specified pregnancy related conditions, unspecified trimester: Secondary | ICD-10-CM

## 2014-05-12 LAB — DIFFERENTIAL
BASOS PCT: 0 % (ref 0–1)
Basophils Absolute: 0 10*3/uL (ref 0.0–0.1)
Eosinophils Absolute: 0 10*3/uL (ref 0.0–0.7)
Eosinophils Relative: 0 % (ref 0–5)
Lymphocytes Relative: 13 % (ref 12–46)
Lymphs Abs: 1.2 10*3/uL (ref 0.7–4.0)
MONO ABS: 0.3 10*3/uL (ref 0.1–1.0)
MONOS PCT: 3 % (ref 3–12)
NEUTROS ABS: 7.6 10*3/uL (ref 1.7–7.7)
Neutrophils Relative %: 84 % — ABNORMAL HIGH (ref 43–77)

## 2014-05-12 LAB — URINE MICROSCOPIC-ADD ON

## 2014-05-12 LAB — RAPID URINE DRUG SCREEN, HOSP PERFORMED
Amphetamines: NOT DETECTED
Amphetamines: NOT DETECTED
BARBITURATES: NOT DETECTED
BENZODIAZEPINES: NOT DETECTED
Barbiturates: NOT DETECTED
Benzodiazepines: NOT DETECTED
COCAINE: NOT DETECTED
COCAINE: NOT DETECTED
OPIATES: POSITIVE — AB
Opiates: POSITIVE — AB
TETRAHYDROCANNABINOL: POSITIVE — AB
Tetrahydrocannabinol: POSITIVE — AB

## 2014-05-12 LAB — PROTIME-INR
INR: 1.09 (ref 0.00–1.49)
Prothrombin Time: 14.1 seconds (ref 11.6–15.2)

## 2014-05-12 LAB — CBG MONITORING, ED: Glucose-Capillary: 100 mg/dL — ABNORMAL HIGH (ref 70–99)

## 2014-05-12 LAB — URINALYSIS, ROUTINE W REFLEX MICROSCOPIC
GLUCOSE, UA: NEGATIVE mg/dL
HGB URINE DIPSTICK: NEGATIVE
Ketones, ur: >80 mg/dL — AB
LEUKOCYTES UA: NEGATIVE
Nitrite: NEGATIVE
PH: 7 (ref 5.0–8.0)
Protein, ur: 30 mg/dL — AB
SPECIFIC GRAVITY, URINE: 1.02 (ref 1.005–1.030)
Urobilinogen, UA: 0.2 mg/dL (ref 0.0–1.0)

## 2014-05-12 LAB — COMPREHENSIVE METABOLIC PANEL
ALBUMIN: 4.1 g/dL (ref 3.5–5.2)
ALBUMIN: 4.3 g/dL (ref 3.5–5.2)
ALT: 96 U/L — ABNORMAL HIGH (ref 0–35)
ALT: 7 U/L (ref 0–35)
ANION GAP: 18 — AB (ref 5–15)
AST: 25 U/L (ref 0–37)
AST: 14 U/L (ref 0–37)
Alkaline Phosphatase: 37 U/L — ABNORMAL LOW (ref 39–117)
Alkaline Phosphatase: 45 U/L (ref 39–117)
Anion gap: 21 — ABNORMAL HIGH (ref 5–15)
BUN: 9 mg/dL (ref 6–23)
BUN: 9 mg/dL (ref 6–23)
CALCIUM: 9.7 mg/dL (ref 8.4–10.5)
CO2: 19 mEq/L (ref 19–32)
CO2: 19 mEq/L (ref 19–32)
CREATININE: 0.52 mg/dL (ref 0.50–1.10)
CREATININE: 0.62 mg/dL (ref 0.50–1.10)
Calcium: 9 mg/dL (ref 8.4–10.5)
Chloride: 98 mEq/L (ref 96–112)
Chloride: 97 mEq/L (ref 96–112)
GFR calc Af Amer: >90 mL/min (ref >90)
GFR calc Af Amer: >90 mL/min (ref >90)
GFR calc non Af Amer: >90 mL/min (ref >90)
Glucose, Bld: 96 mg/dL (ref 70–99)
Glucose, Bld: 79 mg/dL (ref 70–99)
Potassium: 5.6 mEq/L — ABNORMAL HIGH (ref 3.7–5.3)
Potassium: 3.5 mEq/L — ABNORMAL LOW (ref 3.7–5.3)
Sodium: 135 mEq/L — ABNORMAL LOW (ref 137–147)
Sodium: 137 mEq/L (ref 137–147)
Total Bilirubin: 0.3 mg/dL (ref 0.3–1.2)
Total Bilirubin: 0.3 mg/dL (ref 0.3–1.2)
Total Protein: 7.2 g/dL (ref 6.0–8.3)
Total Protein: 7.4 g/dL (ref 6.0–8.3)

## 2014-05-12 LAB — I-STAT CHEM 8, ED
BUN: 8 mg/dL (ref 6–23)
CALCIUM ION: 1.07 mmol/L — AB (ref 1.12–1.23)
Chloride: 103 mEq/L (ref 96–112)
Creatinine, Ser: 0.6 mg/dL (ref 0.50–1.10)
GLUCOSE: 94 mg/dL (ref 70–99)
HEMATOCRIT: 41 % (ref 36.0–46.0)
HEMOGLOBIN: 13.9 g/dL (ref 12.0–15.0)
Potassium: 3.4 mEq/L — ABNORMAL LOW (ref 3.7–5.3)
Sodium: 137 mEq/L (ref 137–147)
TCO2: 21 mmol/L (ref 0–100)

## 2014-05-12 LAB — I-STAT TROPONIN, ED: TROPONIN I, POC: 0.02 ng/mL (ref 0.00–0.08)

## 2014-05-12 LAB — APTT: aPTT: 28 seconds (ref 24–37)

## 2014-05-12 LAB — CBC
HEMATOCRIT: 33.7 % — AB (ref 36.0–46.0)
Hemoglobin: 11.3 g/dL — ABNORMAL LOW (ref 12.0–15.0)
MCH: 28.4 pg (ref 26.0–34.0)
MCHC: 33.5 g/dL (ref 30.0–36.0)
MCV: 84.7 fL (ref 78.0–100.0)
Platelets: 195 10*3/uL (ref 150–400)
RBC: 3.98 MIL/uL (ref 3.87–5.11)
RDW: 13.4 % (ref 11.5–15.5)
WBC: 9.1 10*3/uL (ref 4.0–10.5)

## 2014-05-12 LAB — ETHANOL: Alcohol, Ethyl (B): <11 mg/dL (ref 0–11)

## 2014-05-12 MED ORDER — SODIUM CHLORIDE 0.9 % IV SOLN
INTRAVENOUS | Status: DC
  Administered 2014-05-12: 20:00:00 via INTRAVENOUS

## 2014-05-12 MED ORDER — ONDANSETRON HCL 4 MG/2ML IJ SOLN
4.0000 mg | Freq: Once | INTRAMUSCULAR | Status: AC
  Administered 2014-05-12: 4 mg via INTRAVENOUS
  Filled 2014-05-12: qty 2

## 2014-05-12 MED ORDER — MORPHINE SULFATE 4 MG/ML IJ SOLN
4.0000 mg | Freq: Once | INTRAMUSCULAR | Status: AC
  Administered 2014-05-12: 4 mg via INTRAVENOUS
  Filled 2014-05-12: qty 1

## 2014-05-12 MED ORDER — SODIUM CHLORIDE 0.9 % IV BOLUS (SEPSIS)
1000.0000 mL | Freq: Once | INTRAVENOUS | Status: AC
  Administered 2014-05-12: 1000 mL via INTRAVENOUS

## 2014-05-12 NOTE — MAU Note (Signed)
[facility] arrived to transport patient to [facility].  

## 2014-05-12 NOTE — ED Notes (Signed)
[facility] not inform staff.

## 2014-05-12 NOTE — ED Notes (Addendum)
Pt getting upset in her room, refers "she doesn't have any trust on ED staff that she doesn't understand the reason why we don't feed her through the vein or give her the pain medication that she needs at this time; that she is been coming every day for treatment and we just ignore her symptoms." family member at the bedside.  Pt refuses to give a urine sample at this time. POC explained to the pt and pt very uncooperative with POC.

## 2014-05-12 NOTE — MAU Note (Signed)
Pt called out that she "could not see." Pt BP checked. Artelia LarocheM Williams CNM notified.

## 2014-05-12 NOTE — MAU Note (Signed)
Patient states she has had lower abdominal pain that started on the left side 2 days ago now going all the way around. Has had nausea and vomiting of everything she eats and drinks for 3 days. Denies bleeding or discharge.

## 2014-05-12 NOTE — Consult Note (Signed)
Referring Physician: ED    Chief Complaint: right arm weakness, visual changes left eye.  HPI:                                                                                                                                         Karina [name] is an 33 y.o. female, currently [redacted] weeks pregnant, with a past medical history significant for HTN, preeclampsia, HA, viral meningitis, depression, brought in bu EMS as a code stroke due to the above mentioned symptoms. She was reportedly last seen well at 1624 when she complained of visual changes characterized by seeing " spots" in the left eye no associated with HA, vertigo, double vision, confusion, slurred speech, or language impairment. There was an initial report of patient having visual loss in the left eye but she adamantly denies that assertion. On the other hand, she said that her right arm has been weeks for several weeks. Karina [name] was evaluated in the ED yesterday due to lower abdominal pain and was diagnosed with ovarian cyst and she returned to the ED today complaining of pain on opposite side.[facility] She did not inform staff. Karina [name] had NIHSS 5 but her overall exam was unreliable, with many inconsistencies. CT brain was unremarkable for acute abnormality. Date last known well: 05/12/14 Time last known well: 1624 tPA Given: no, inconsistent exam, out of the window, pregnant. NIHSS: 5   Past Medical History  Diagnosis Date  . Preterm labor   . Depression   . Chlamydia   . Viral meningitis   . Abnormal Pap smear   . Headache(784.0)   . UTI (lower urinary tract infection)   . Hypertension   . Pregnancy induced hypertension   . Infection   . IUFD (intrauterine fetal death) 09/25/04    Term IUFD at 78 weeks, unknown cause     Past Surgical History  Procedure Laterality Date  . Elective abortion      Family History  Problem Relation Age of Onset  . Cancer Mother   . Diabetes Mother    . Hyperlipidemia Mother   . Depression Mother   . Hypertension Mother   . Mental illness Mother   . Arthritis Maternal Grandmother   . Diabetes Maternal Grandmother   . Stroke Maternal Grandmother   . Diabetes Maternal Grandfather   . Hypertension Maternal Grandfather    Social History:  reports that she has been smoking.  She has never used smokeless tobacco. She reports that she uses illicit drugs (Marijuana). She reports that she does not drink alcohol.  Allergies:  Allergies  Allergen Reactions  . Latex Itching and Rash    Medications:  I have reviewed the patient's current medications.  ROS:                                                                                                                                       History obtained from the patient and chart review  General ROS: negative for - chills, fatigue, fever, night sweats, weight loss Psychological ROS: negative for - behavioral disorder, hallucinations, memory difficulties, or suicidal ideation Ophthalmic ROS: negative for - blurry vision, double vision, eye pain or loss of vision ENT ROS: negative for - epistaxis, nasal discharge, oral lesions, sore throat, tinnitus or vertigo Allergy and Immunology ROS: negative for - hives or itchy/watery eyes Hematological and Lymphatic ROS: negative for - bleeding problems, bruising or swollen lymph nodes Endocrine ROS: negative for - galactorrhea, hair pattern changes, polydipsia/polyuria or temperature intolerance Respiratory ROS: negative for - cough, hemoptysis, shortness of breath or wheezing Cardiovascular ROS: negative for - chest pain, dyspnea on exertion, edema or irregular heartbeat Gastrointestinal ROS: negative for - abdominal pain, diarrhea, hematemesis, nausea/vomiting or stool incontinence Genito-Urinary ROS: negative for - dysuria,  hematuria, incontinence or urinary frequency/urgency Musculoskeletal ROS: negative for - joint swelling Neurological ROS: as noted in HPI Dermatological ROS: negative for rash and skin lesion changes  Physical exam: pleasant female in no apparent distress. Blood pressure 130/83, pulse 58, temperature 98.7 F (37.1 C), temperature source Oral, resp. rate 19, last menstrual period 04/02/2014, SpO2 100.00%. Head: normocephalic. Neck: supple, no bruits, no JVD. Cardiac: no murmurs. Lungs: clear. Abdomen: soft, no tender, no mass. Extremities: no edema. Neurologic Examination:                                                                                                      Mental Status: Alert, oriented, thought content appropriate.  Speech fluent without evidence of aphasia.  Able to follow 3 step commands without difficulty. Cranial Nerves: II: Discs flat bilaterally; Visual fields grossly normal, pupils equal, round, reactive to light and accommodation III,IV, VI: ptosis not present, extra-ocular motions intact bilaterally V,VII: smile symmetric, facial light touch sensation normal bilaterally VIII: hearing normal bilaterally IX,X: gag reflex present XI: bilateral shoulder shrug XII: midline tongue extension without atrophy or fasciculations Motor: Patient has normal strength in the upper extremities and makes no effort on testing of her legs (ALTHOUGH IS ABLE TO MOVE HER LEGS SYMMETRICALLY WHEN DISTRACTED)  Tone and bulk:normal tone throughout; no atrophy noted Sensory: Pinprick and light touch intact throughout, bilaterally Deep Tendon  Reflexes:  Right: Upper Extremity   Left: Upper extremity   biceps (C-5 to C-6) 2/4   biceps (C-5 to C-6) 2/4 tricep (C7) 2/4    triceps (C7) 2/4 Brachioradialis (C6) 2/4  Brachioradialis (C6) 2/4  Lower Extremity Lower Extremity  quadriceps (L-2 to L-4) 2/4   quadriceps (L-2 to L-4) 2/4 Achilles (S1) 2/4   Achilles (S1) 2/4  Plantars: Right:  downgoing   Left: downgoing Cerebellar: normal finger-to-nose,  normal heel-to-shin test Gait:  No tested  Results for orders placed during the hospital encounter of 05/12/14 (from the past 48 hour(s))  URINALYSIS, ROUTINE W REFLEX MICROSCOPIC     Status: Abnormal   Collection Time    05/12/14  6:58 PM      Result Value Ref Range   Color, Urine YELLOW  YELLOW   APPearance CLEAR  CLEAR   Specific Gravity, Urine 1.020  1.005 - 1.030   pH 7.0  5.0 - 8.0   Glucose, UA NEGATIVE  NEGATIVE mg/dL   Hgb urine dipstick NEGATIVE  NEGATIVE   Bilirubin Urine SMALL (*) NEGATIVE   Ketones, ur >80 (*) NEGATIVE mg/dL   Protein, ur 30 (*) NEGATIVE mg/dL   Urobilinogen, UA 0.2  0.0 - 1.0 mg/dL   Nitrite NEGATIVE  NEGATIVE   Leukocytes, UA NEGATIVE  NEGATIVE  URINE MICROSCOPIC-ADD ON     Status: None   Collection Time    05/12/14  6:58 PM      Result Value Ref Range   Squamous Epithelial / LPF RARE  RARE   WBC, UA 0-2  <3 WBC/hpf   RBC / HPF 0-2  <3 RBC/hpf   Bacteria, UA RARE  RARE   Urine-Other MUCOUS PRESENT    ETHANOL     Status: None   Collection Time    05/12/14  8:24 PM      Result Value Ref Range   Alcohol, Ethyl (B) <11  0 - 11 mg/dL   Comment:            LOWEST DETECTABLE LIMIT FOR     SERUM ALCOHOL IS 11 mg/dL     FOR MEDICAL PURPOSES ONLY  PROTIME-INR     Status: None   Collection Time    05/12/14  8:24 PM      Result Value Ref Range   Prothrombin Time 14.1  11.6 - 15.2 seconds   INR 1.09  0.00 - 1.49  APTT     Status: None   Collection Time    05/12/14  8:24 PM      Result Value Ref Range   aPTT 28  24 - 37 seconds  CBC     Status: Abnormal   Collection Time    05/12/14  8:24 PM      Result Value Ref Range   WBC 9.1  4.0 - 10.5 K/uL   RBC 3.98  3.87 - 5.11 MIL/uL   Hemoglobin 11.3 (*) 12.0 - 15.0 g/dL   HCT 33.7 (*) 36.0 - 46.0 %   MCV 84.7  78.0 - 100.0 fL   MCH 28.4  26.0 - 34.0 pg   MCHC 33.5  30.0 - 36.0 g/dL   RDW 13.4  11.5 - 15.5 %   Platelets 195  150  - 400 K/uL   Comment: DELTA CHECK NOTED     SPECIMEN CHECKED FOR CLOTS     REPEATED TO VERIFY  DIFFERENTIAL     Status: Abnormal   Collection Time  05/12/14  8:24 PM      Result Value Ref Range   Neutrophils Relative % 84 (*) 43 - 77 %   Neutro Abs 7.6  1.7 - 7.7 K/uL   Lymphocytes Relative 13  12 - 46 %   Lymphs Abs 1.2  0.7 - 4.0 K/uL   Monocytes Relative 3  3 - 12 %   Monocytes Absolute 0.3  0.1 - 1.0 K/uL   Eosinophils Relative 0  0 - 5 %   Eosinophils Absolute 0.0  0.0 - 0.7 K/uL   Basophils Relative 0  0 - 1 %   Basophils Absolute 0.0  0.0 - 0.1 K/uL  COMPREHENSIVE METABOLIC PANEL     Status: Abnormal   Collection Time    05/12/14  8:24 PM      Result Value Ref Range   Sodium 135 (*) 137 - 147 mEq/L   Potassium 5.6 (*) 3.7 - 5.3 mEq/L   Comment: HEMOLYSIS AT THIS LEVEL MAY AFFECT RESULT     DELTA CHECK NOTED   Chloride 98  96 - 112 mEq/L   CO2 19  19 - 32 mEq/L   Glucose, Bld 96  70 - 99 mg/dL   BUN 9  6 - 23 mg/dL   Creatinine, Ser 0.52  0.50 - 1.10 mg/dL   Calcium 9.0  8.4 - 10.5 mg/dL   Total Protein 7.2  6.0 - 8.3 g/dL   Albumin 4.1  3.5 - 5.2 g/dL   AST 25  0 - 37 U/L   Comment: HEMOLYSIS AT THIS LEVEL MAY AFFECT RESULT   ALT 96 (*) 0 - 35 U/L   Comment: HEMOLYSIS AT THIS LEVEL MAY AFFECT RESULT   Alkaline Phosphatase 37 (*) 39 - 117 U/L   Comment: HEMOLYSIS AT THIS LEVEL MAY AFFECT RESULT   Total Bilirubin 0.3  0.3 - 1.2 mg/dL   GFR calc non Af Amer >90  >90 mL/min   GFR calc Af Amer >90  >90 mL/min   Comment: (NOTE)     The eGFR has been calculated using the CKD EPI equation.     This calculation has not been validated in all clinical situations.     eGFR's persistently <90 mL/min signify possible Chronic Kidney     Disease.   Anion gap 18 (*) 5 - 15  CBG MONITORING, ED     Status: Abnormal   Collection Time    05/12/14  8:40 PM      Result Value Ref Range   Glucose-Capillary 100 (*) 70 - 99 mg/dL  I-STAT TROPOININ, ED     Status: None    Collection Time    05/12/14  8:52 PM      Result Value Ref Range   Troponin i, poc 0.02  0.00 - 0.08 ng/mL   Comment 3            Comment: Due to the release kinetics of cTnI,     a negative result within the first hours     of the onset of symptoms does not rule out     myocardial infarction with certainty.     If myocardial infarction is still suspected,     repeat the test at appropriate intervals.  I-STAT CHEM 8, ED     Status: Abnormal   Collection Time    05/12/14  8:55 PM      Result Value Ref Range   Sodium 137  137 - 147 mEq/L   Potassium  3.4 (*) 3.7 - 5.3 mEq/L   Chloride 103  96 - 112 mEq/L   BUN 8  6 - 23 mg/dL   Creatinine, Ser 0.60  0.50 - 1.10 mg/dL   Glucose, Bld 94  70 - 99 mg/dL   Calcium, Ion 1.07 (*) 1.12 - 1.23 mmol/L   TCO2 21  0 - 100 mmol/L   Hemoglobin 13.9  12.0 - 15.0 g/dL   HCT 41.0  36.0 - 46.0 %   Ct Head Wo Contrast  05/12/2014   CLINICAL DATA:  Abdominal pain, right arm weakness, [redacted] weeks pregnant  EXAM: CT HEAD WITHOUT CONTRAST  TECHNIQUE: Contiguous axial images were obtained from the base of the skull through the vertex without intravenous contrast.  COMPARISON:  None.  FINDINGS: No skull fracture is noted. Paranasal sinuses shows nodular mucosal thickening probable mucous retention cyst right maxillary sinus measures about 8 mm.  No intracranial hemorrhage, mass effect or midline shift. No acute cortical infarction. The gray and white-matter differentiation is preserved. No mass lesion is noted on this unenhanced scan.  IMPRESSION: No acute intracranial abnormality. Probable mucous retention cyst right maxillary sinus.   Electronically Signed   By: Lahoma Crocker M.D.   On: 05/12/2014 20:48   US Ob Comp Less 14 Wks  05/11/2014   CLINICAL DATA:  Left-sided abdominal pain with nausea and vomiting. First trimester pregnancy. LMP 03/30/2014.  EXAM: OBSTETRIC <14 WK [place] AND TRANSVAGINAL OB US  TECHNIQUE: Both transabdominal and transvaginal ultrasound  examinations were performed for complete evaluation of the gestation as well as the maternal uterus, adnexal regions, and pelvic cul-de-sac. Transvaginal technique was performed to assess early pregnancy.  COMPARISON:  None.  FINDINGS: Intrauterine gestational sac: Visualized/normal in shape.  Yolk sac:  Visualized.  Embryo:  Visualized.  Cardiac Activity: Visualized.  Heart Rate:  118 bpm  CRL:   3.8  mm   6 w 0 d                  [place] EDC: 01/04/2015  Maternal uterus/adnexae: There is no significant subchorionic hematoma. Both maternal ovaries appear normal. Trace free pelvic fluid is noted asymmetric to the left.  IMPRESSION: Single live intrauterine gestation with best estimated gestational age of [redacted] weeks 0 days. No acute findings demonstrated.   Electronically Signed   By: Camie Patience M.D.   On: 05/11/2014 20:30   US Ob Transvaginal  05/11/2014   CLINICAL DATA:  Left-sided abdominal pain with nausea and vomiting. First trimester pregnancy. LMP 03/30/2014.  EXAM: OBSTETRIC <14 WK [place] AND TRANSVAGINAL OB US  TECHNIQUE: Both transabdominal and transvaginal ultrasound examinations were performed for complete evaluation of the gestation as well as the maternal uterus, adnexal regions, and pelvic cul-de-sac. Transvaginal technique was performed to assess early pregnancy.  COMPARISON:  None.  FINDINGS: Intrauterine gestational sac: Visualized/normal in shape.  Yolk sac:  Visualized.  Embryo:  Visualized.  Cardiac Activity: Visualized.  Heart Rate:  118 bpm  CRL:   3.8  mm   6 w 0 d                  [place] EDC: 01/04/2015  Maternal uterus/adnexae: There is no significant subchorionic hematoma. Both maternal ovaries appear normal. Trace free pelvic fluid is noted asymmetric to the left.  IMPRESSION: Single live intrauterine gestation with best estimated gestational age of [redacted] weeks 0 days. No acute findings demonstrated.   Electronically Signed   By: Modesta Messing.D.  On: 05/11/2014 20:30    Assessment: 33 y.o. female,  currently [redacted] weeks pregnant, brought in by EMS due to right arm weakness, visual changes left eye. Very inconsistent neuro-exam. CT brain negative. No a candidate for tpa due to late presentation, pregnancy, and rather functional exam. No further neurological intervention needed at this moment.   Dorian Pod, MD [facility] [phone]  05/12/2014, 11:00 PM

## 2014-05-12 NOTE — ED Notes (Signed)
Pt c/o lower abd pain; pt sts was here yesterday and diagnosed with ovarian cyst; pt sts pain on opposite side today

## 2014-05-12 NOTE — ED Notes (Signed)
Pt noted to be approx [redacted] weeks pregnant via US yesterday

## 2014-05-12 NOTE — ED Provider Notes (Signed)
CSN: [id number]     Arrival date & time 05/12/14  1836 History   First MD Initiated Contact with Patient 05/12/14 2023     Chief Complaint  Patient presents with  . Code Stroke     (Consider location/radiation/quality/duration/timing/severity/associated sxs/prior Treatment) Patient is a 33 y.o. female presenting with abdominal pain.  Abdominal Pain Pain location:  LUQ and LLQ Pain quality: cramping   Pain radiates to:  Does not radiate Pain severity:  Moderate Duration:  1 day Timing:  Constant Associated symptoms: nausea and vomiting   Associated symptoms: no chest pain, no constipation, no cough, no diarrhea, no dysuria, no shortness of breath and no vaginal discharge    33 y/o female, currently [redacted] weeks pregnant, with a past medical history significant for HTN, preeclampsia, HA, viral meningitis, depression, brought in by EMS as a code stroke due report of sudden vision loss in the left eye and weakness of the right side, which has been present for weeks. Patient upon arrival states that she did not have vision loss but instead sees large yellow spots in her vision and that she mostly complains of left sided abdominal pain. Patient endorses severe nausea/vomting for the last 2 days but denies fevers, bloody or bilious vomit or diarrhea. Patient also denies dysuria, hematuria.    Past Medical History  Diagnosis Date  . Preterm labor   . Depression   . Chlamydia   . Viral meningitis   . Abnormal Pap smear   . Headache(784.0)   . UTI (lower urinary tract infection)   . Hypertension   . Pregnancy induced hypertension   . Infection   . IUFD (intrauterine fetal death) 22-Sep-2004    Term IUFD at 40 weeks, unknown cause    Past Surgical History  Procedure Laterality Date  . Elective abortion     Family History  Problem Relation Age of Onset  . Cancer Mother   . Diabetes Mother   . Hyperlipidemia Mother   . Depression Mother   . Hypertension Mother   . Mental illness Mother    . Arthritis Maternal Grandmother   . Diabetes Maternal Grandmother   . Stroke Maternal Grandmother   . Diabetes Maternal Grandfather   . Hypertension Maternal Grandfather    History  Substance Use Topics  . Smoking status: Current Some Day Smoker  . Smokeless tobacco: Never Used  . Alcohol Use: No     Comment: Pt smokes "weed" everyday   OB History   Grav Para Term Preterm Abortions TAB SAB Ect Mult Living   15 7 5 2 7 4 3  0 0 6     Review of Systems  Constitutional: Negative for activity change.  HENT: Negative for congestion.   Eyes: Positive for visual disturbance.  Respiratory: Negative for cough and shortness of breath.   Cardiovascular: Negative for chest pain and leg swelling.  Gastrointestinal: Positive for nausea, vomiting and abdominal pain. Negative for diarrhea, constipation, blood in stool and abdominal distention.  Genitourinary: Negative for dysuria, flank pain and vaginal discharge.  Musculoskeletal: Negative for back pain.  Skin: Negative for color change.  Neurological: Negative for syncope and headaches.  Psychiatric/Behavioral: Negative for agitation.      Allergies  Latex  Home Medications   Prior to Admission medications   Medication Sig Start Date End Date Taking? Authorizing Provider  HYDROcodone Bitartrate 10 MG C12A Take 1 tablet by mouth every 6 (six) hours as needed (Used for oral procedure.).   Yes Historical Provider,  MD  ibuprofen (ADVIL,MOTRIN) 600 MG tablet Take 600 mg by mouth every 6 (six) hours as needed for moderate pain.   Yes Historical Provider, MD  penicillin v potassium (VEETID) 500 MG tablet Take 500 mg by mouth.   Yes Historical Provider, MD   BP 147/81  Pulse 67  Temp(Src) 98.7 F (37.1 C) (Oral)  Resp 16  SpO2 100%  LMP 04/02/2014 Physical Exam  Constitutional: She is oriented to person, place, and time. She appears well-developed.  HENT:  Head: Normocephalic.  Eyes: Pupils are equal, round, and reactive to light.   Neck: Neck supple.  Cardiovascular: Normal rate.  Exam reveals no gallop and no friction rub.   No murmur heard. Pulmonary/Chest: Effort normal and breath sounds normal. No respiratory distress.  Abdominal: Soft. She exhibits no distension. There is tenderness in the left upper quadrant and left lower quadrant. There is no rigidity, no rebound, no guarding, no CVA tenderness, no tenderness at McBurney's point and negative Murphy's sign.  Musculoskeletal: She exhibits no edema.  Neurological: She is alert and oriented to person, place, and time. No cranial nerve deficit or sensory deficit.  Reflex Scores:      Tricep reflexes are 2+ on the right side and 2+ on the left side.      Bicep reflexes are 2+ on the right side and 2+ on the left side.      Brachioradialis reflexes are 2+ on the right side and 2+ on the left side.      Patellar reflexes are 2+ on the right side and 2+ on the left side.      Achilles reflexes are 2+ on the right side and 2+ on the left side. Neuro exam is inconsistent  Skin: Skin is warm.  Psychiatric: She has a normal mood and affect.    ED Course  Procedures (including critical care time) Labs Review Labs Reviewed  URINALYSIS, ROUTINE W REFLEX MICROSCOPIC - Abnormal; Notable for the following:    Bilirubin Urine SMALL (*)    Ketones, ur >80 (*)    Protein, ur 30 (*)    All other components within normal limits  I-STAT CHEM 8, ED - Abnormal; Notable for the following:    Potassium 3.4 (*)    Calcium, Ion 1.07 (*)    All other components within normal limits  CBG MONITORING, ED - Abnormal; Notable for the following:    Glucose-Capillary 100 (*)    All other components within normal limits  URINE MICROSCOPIC-ADD ON  ETHANOL  PROTIME-INR  APTT  CBC  DIFFERENTIAL  COMPREHENSIVE METABOLIC PANEL  URINE RAPID DRUG SCREEN (HOSP PERFORMED)  I-STAT TROPOININ, ED    Imaging Review Ct Head Wo Contrast  05/12/2014   CLINICAL DATA:  Abdominal pain, right arm  weakness, [redacted] weeks pregnant  EXAM: CT HEAD WITHOUT CONTRAST  TECHNIQUE: Contiguous axial images were obtained from the base of the skull through the vertex without intravenous contrast.  COMPARISON:  None.  FINDINGS: No skull fracture is noted. Paranasal sinuses shows nodular mucosal thickening probable mucous retention cyst right maxillary sinus measures about 8 mm.  No intracranial hemorrhage, mass effect or midline shift. No acute cortical infarction. The gray and white-matter differentiation is preserved. No mass lesion is noted on this unenhanced scan.  IMPRESSION: No acute intracranial abnormality. Probable mucous retention cyst right maxillary sinus.   Electronically Signed   By: Natasha Mead M.D.   On: 05/12/2014 20:48   US Ob Comp Less 14 Wks  05/11/2014   CLINICAL DATA:  Left-sided abdominal pain with nausea and vomiting. First trimester pregnancy. LMP 03/30/2014.  EXAM: OBSTETRIC <14 WK US AND TRANSVAGINAL OB US  TECHNIQUE: Both transabdominal and transvaginal ultrasound examinations were performed for complete evaluation of the gestation as well as the maternal uterus, adnexal regions, and pelvic cul-de-sac. Transvaginal technique was performed to assess early pregnancy.  COMPARISON:  None.  FINDINGS: Intrauterine gestational sac: Visualized/normal in shape.  Yolk sac:  Visualized.  Embryo:  Visualized.  Cardiac Activity: Visualized.  Heart Rate:  118 bpm  CRL:   3.8  mm   6 w 0 d                  US EDC: 01/04/2015  Maternal uterus/adnexae: There is no significant subchorionic hematoma. Both maternal ovaries appear normal. Trace free pelvic fluid is noted asymmetric to the left.  IMPRESSION: Single live intrauterine gestation with best estimated gestational age of [redacted] weeks 0 days. No acute findings demonstrated.   Electronically Signed   By: Roxy HorsemanBill  Veazey M.D.   On: 05/11/2014 20:30   [place] Ob Transvaginal  05/11/2014   CLINICAL DATA:  Left-sided abdominal pain with nausea and vomiting. First trimester  pregnancy. LMP 03/30/2014.  EXAM: OBSTETRIC <14 WK US AND TRANSVAGINAL OB US  TECHNIQUE: Both transabdominal and transvaginal ultrasound examinations were performed for complete evaluation of the gestation as well as the maternal uterus, adnexal regions, and pelvic cul-de-sac. Transvaginal technique was performed to assess early pregnancy.  COMPARISON:  None.  FINDINGS: Intrauterine gestational sac: Visualized/normal in shape.  Yolk sac:  Visualized.  Embryo:  Visualized.  Cardiac Activity: Visualized.  Heart Rate:  118 bpm  CRL:   3.8  mm   6 w 0 d                  US EDC: 01/04/2015  Maternal uterus/adnexae: There is no significant subchorionic hematoma. Both maternal ovaries appear normal. Trace free pelvic fluid is noted asymmetric to the left.  IMPRESSION: Single live intrauterine gestation with best estimated gestational age of [redacted] weeks 0 days. No acute findings demonstrated.   Electronically Signed   By: Roxy HorsemanBill  Veazey M.D.   On: 05/11/2014 20:30     EKG Interpretation None      MDM   33 y/o female who present with multiple complaints. Was brought in by EMS as a code stroke for concern of left eye blindness, which later states is not the case and it was only yellow spots in her vision. She endorses right sided weakness that has been present for weeks, Patient also is [redacted] weeks pregnant and states that she has left sided abdominal pain.   Neurology evaluated the patient and a STAT CT head was performed secondary to neuro symptoms. There was no pathology noted on CT head and a thorough neuro exam assessment was performed by neurology. They state that the exam is inconsistent and no further intervention is indicated at this time.   The patient also endorses abdominal pain. US from yesterday was reviewed showing that she has a 6 week IUP, unlikely heterogenous ectopic. Patient does not have guarding or rebound on exam and pain improved with hydration and single does of morphine. CBC, CMP non  contributory, urine does not demonstrate an infection. Unlikely pyelonephritis, SOB or diverticulitis based on history and exam. Likely benign abdominal pain.   Patient demonstrated ability to tolerate PO in the department and was then discharged home with a prescription for  Zofran.   Patient    Final diagnoses:  Loss of vision  Abdominal pain affecting pregnancy       Clement SayresStaci Mcgregor Tinnon, MD 05/13/14 2201

## 2014-05-12 NOTE — MAU Provider Note (Signed)
History     CSN: [id number]  Arrival date and time: 05/12/14 1836   First Provider Initiated Contact with Patient 05/12/14 1930      Chief Complaint  Patient presents with  . Abdominal Pain   Abdominal Pain Associated symptoms include headaches (started within the past hour), nausea and vomiting. Pertinent negatives include no fever or myalgias.   This is a 33 y.o. female at 14100w1d by US who presented from [facility] (Left without being seen) for evaluation of persistent lower abdominal pain which had moved from LLQ to RLQ.  [facility] and had a full evaluation.  She was found to have a SIUP at 6.0 weeks with no abnormalities.  [facility] and complained of worsening pain. States "they didn't care about me there" and left without being seen and came here.   Upon arrival here, she developed a headache. She states she told the Triage Nurse that she was "seeing yellow spots" but RN does not report this.   While waiting to be seen, she reported to the RN that she "could not see".    Triage RN Note:  Patient states she has had lower abdominal pain that started on the left side 2 days ago now going all the way around. Has had nausea and vomiting of everything she eats and drinks for 3 days. Denies bleeding or discharge.        OB History   Grav Para Term Preterm Abortions TAB SAB Ect Mult Living   15 7 5 2 7 4 3  0 0 6      Past Medical History  Diagnosis Date  . Preterm labor   . Depression   . Chlamydia   . Viral meningitis   . Abnormal Pap smear   . Headache(784.0)   . UTI (lower urinary tract infection)   . Hypertension   . Pregnancy induced hypertension   . Infection   . IUFD (intrauterine fetal death) 09/06/2004    Term IUFD at 40 weeks, unknown cause     Past Surgical History  Procedure Laterality Date  . Elective abortion      Family History  Problem Relation Age of Onset  . Cancer Mother   . Diabetes Mother   .  Hyperlipidemia Mother   . Depression Mother   . Hypertension Mother   . Mental illness Mother   . Arthritis Maternal Grandmother   . Diabetes Maternal Grandmother   . Stroke Maternal Grandmother   . Diabetes Maternal Grandfather   . Hypertension Maternal Grandfather     History  Substance Use Topics  . Smoking status: Current Some Day Smoker  . Smokeless tobacco: Never Used  . Alcohol Use: No     Comment: Pt smokes "weed" everyday    Allergies:  Allergies  Allergen Reactions  . Latex Itching and Rash    No prescriptions prior to admission    Review of Systems  Constitutional: Negative for fever, chills and malaise/fatigue.  Eyes:       Loss of vision States was "seeing yellow spots" during triage  Respiratory: Negative for shortness of breath.   Cardiovascular: Negative for chest pain.  Gastrointestinal: Positive for nausea, vomiting and abdominal pain.  Musculoskeletal: Positive for back pain. Negative for myalgias.  Neurological: Positive for tingling (States right arm and shoulder have been numb "for a long time"), weakness and headaches (started within the past hour). Negative for dizziness, speech change, focal  weakness and loss of consciousness.   Physical Exam   Blood pressure 143/92, pulse 60, temperature 98.7 F (37.1 C), temperature source Oral, resp. rate 22, last menstrual period 04/02/2014, SpO2 100.00%. Filed Vitals:   05/12/14 1931 05/12/14 1933 05/12/14 1956 05/12/14 2005  BP: 148/101 143/92  143/90  Pulse: 84 60 112 60  Temp:      TempSrc:      Resp: 22 22  20   SpO2:  100% 87% 93%    Physical Exam  Constitutional: She is oriented to person, place, and time. She appears well-developed and well-nourished. No distress.  HENT:  Head: Normocephalic.  Eyes: Conjunctivae and EOM are normal. Pupils are equal, round, and reactive to light.  ? Nystagmus with eye movement  Neck: Normal range of motion. Neck supple.  Cardiovascular: Normal rate and  regular rhythm.   Respiratory: Effort normal and breath sounds normal.  GI: Soft. She exhibits no distension. There is no tenderness. There is no rebound and no guarding.  Musculoskeletal: Normal range of motion.  Neurological: She is alert and oriented to person, place, and time. She displays normal reflexes. She exhibits normal muscle tone.  Bilateral hand grips strong and equal Bilateral dorsiflexion and plantar flexion strong and equal   Skin: Skin is warm and dry.  Psychiatric: She has a normal mood and affect.    MAU Course  Procedures  MDM Results for orders placed during the hospital encounter of 05/12/14 (from the past 24 hour(s))  URINALYSIS, ROUTINE W REFLEX MICROSCOPIC     Status: Abnormal   Collection Time    05/12/14  6:58 PM      Result Value Ref Range   Color, Urine YELLOW  YELLOW   APPearance CLEAR  CLEAR   Specific Gravity, Urine 1.020  1.005 - 1.030   pH 7.0  5.0 - 8.0   Glucose, UA NEGATIVE  NEGATIVE mg/dL   Hgb urine dipstick NEGATIVE  NEGATIVE   Bilirubin Urine SMALL (*) NEGATIVE   Ketones, ur >80 (*) NEGATIVE mg/dL   Protein, ur 30 (*) NEGATIVE mg/dL   Urobilinogen, UA 0.2  0.0 - 1.0 mg/dL   Nitrite NEGATIVE  NEGATIVE   Leukocytes, UA NEGATIVE  NEGATIVE  URINE MICROSCOPIC-ADD ON     Status: None   Collection Time    05/12/14  6:58 PM      Result Value Ref Range   Squamous Epithelial / LPF RARE  RARE   WBC, UA 0-2  <3 WBC/hpf   RBC / HPF 0-2  <3 RBC/hpf   Bacteria, UA RARE  RARE   Urine-Other MUCOUS PRESENT       Assessment and Plan  A:  SIUP at 5137w1d        Lower abdominal pain (no time for evaluation of this, but [facility])       New onset hypertension       New onset loss of vision       New onset headache  P:   Consulted Dr Shawnie PonsPratt who [facility]        Called charge nurse, states pt qualifies for Code Stroke         [facility] here to transport          Christiana [facility] Hospital[facility]ILLIAMS,Carmelle Bamberg 05/12/2014, 7:52 PM

## 2014-05-12 NOTE — Progress Notes (Signed)
Artelia LarocheM. Williams CNM at bedside for further evaluation of patient. Serial BP's and pulse ox initiated.

## 2014-05-12 NOTE — Progress Notes (Addendum)
20 G IV started with Normal Saline per M. Williams CNM.

## 2014-05-13 MED ORDER — ONDANSETRON HCL 4 MG PO TABS: 4.0000 mg | ORAL_TABLET | Freq: Four times a day (QID) | ORAL | Status: DC

## 2014-05-13 MED ORDER — PROMETHAZINE HCL 50 MG PO TABS: 50.0000 mg | ORAL_TABLET | Freq: Four times a day (QID) | ORAL | Status: DC | PRN

## 2014-05-13 NOTE — MAU Provider Note (Signed)
Attestation of Attending Supervision of Advanced Practitioner (PA/CNM/NP): Evaluation and management procedures were performed by the Advanced Practitioner under my supervision and collaboration.  I have reviewed the Advanced Practitioner's note and chart, and I agree with the management and plan.  Reva BoresPRATT,Vivia Rosenburg S, MD [facility] Attending 05/13/2014 12:57 AM

## 2014-05-13 NOTE — ED Notes (Signed)
Pt taking a PO challenge at this time.

## 2014-05-13 NOTE — Discharge Instructions (Signed)

## 2014-05-14 NOTE — ED Provider Notes (Signed)
I saw and evaluated the patient, reviewed the resident's note and I agree with the findings and plan.   EKG Interpretation   Date/Time:  Wednesday May 12 2014 20:53:51 EDT Ventricular Rate:  65 PR Interval:  137 QRS Duration: 85 QT Interval:  420 QTC Calculation: 437 R Axis:   60 Text Interpretation:  Sinus rhythm Borderline T wave abnormalities  Confirmed by Rubin PayorPICKERING  MD, Harrold DonathNATHAN [phone]) on 05/14/2014 12:00:04 AM     Patient presents with multiple complaints. [facility] code stroke because she began to have yellow vision in her left eye. She was seen in the day prior to [place] with abdominal pain in her pregnancy. She came back today for continued pain nausea vomiting diarrhea. She was waiting in the ER and decided it was taking too long so [facility] and then stated that she began to have her initially came in as a code stroke and seen by neurology. Code stroke was canceled after negative imaging. Patient's abdominal pain is improved. She did have 80 ketones in urine and is feeling better. will be discharged home  Juliet Rudeathan R. Rubin PayorPickering, MD 05/14/14 0001

## 2014-09-13 ENCOUNTER — Encounter (HOSPITAL_COMMUNITY): Payer: Self-pay

## 2015-03-14 IMAGING — CT CT HEAD W/O CM
2 series · 16 of 30 positions shown, 20 images · non-contrast
Comparison: None.

CLINICAL DATA: Abdominal pain, right arm weakness, 6 weeks pregnant

EXAM:
CT HEAD WITHOUT CONTRAST
TECHNIQUE: Contiguous axial images were obtained from the base of the skull
through the vertex without intravenous contrast.

[Series 201: head w/o, idose (1) · axial · non-contrast · 0.49mm/px · z∈[+85,+210]mm · 13 of 31 slices shown, 17 images]
[im 3/31  brain]
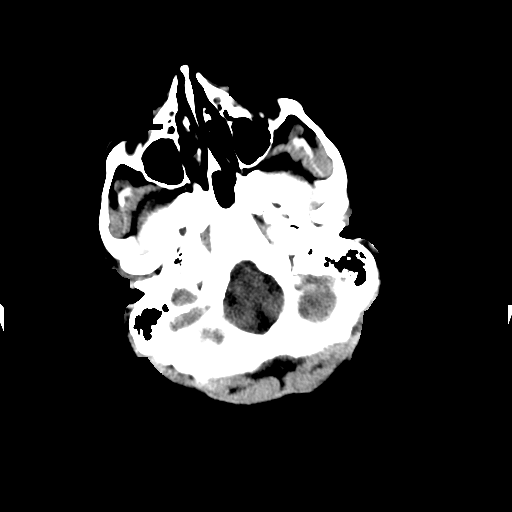
[im 3/31  bone]
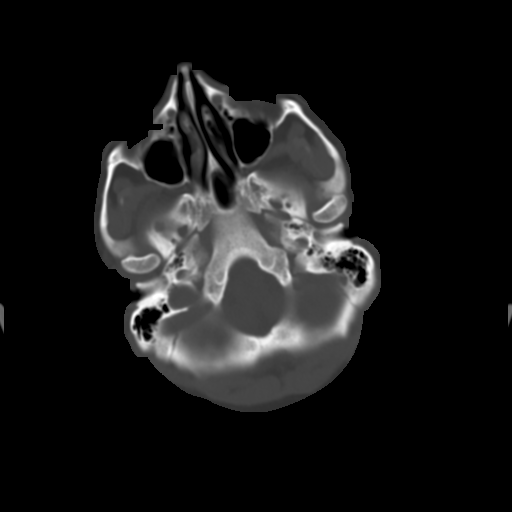
[im 5/31  brain]
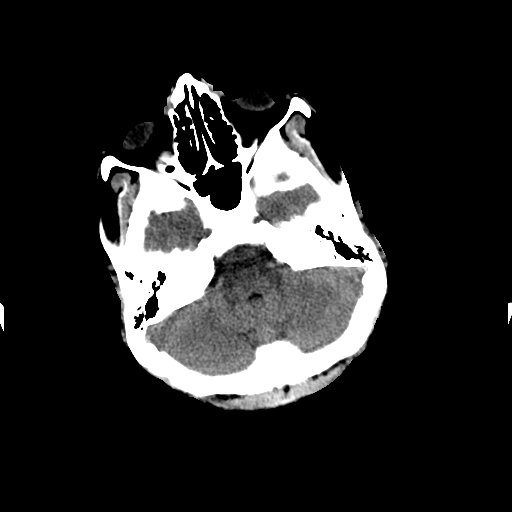
[im 7/31  brain]
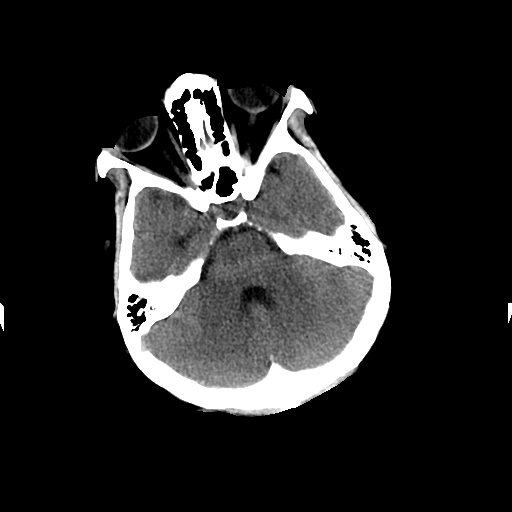
[im 9/31  brain]
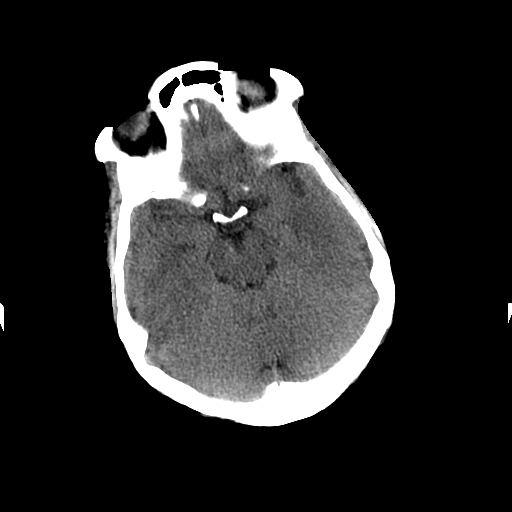
[im 11/31  brain]
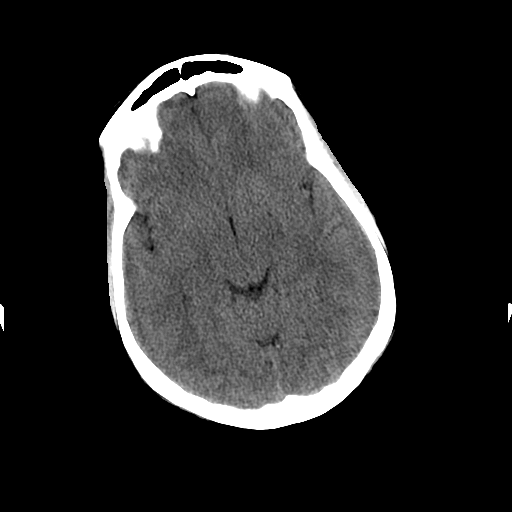
[im 11/31  bone]
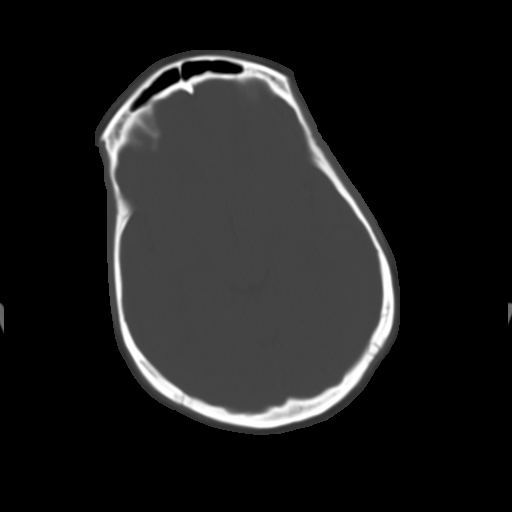
[im 13/31  brain]
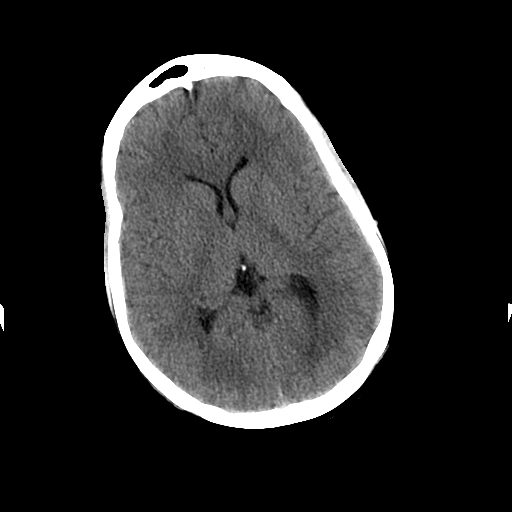
[im 16/31  brain]
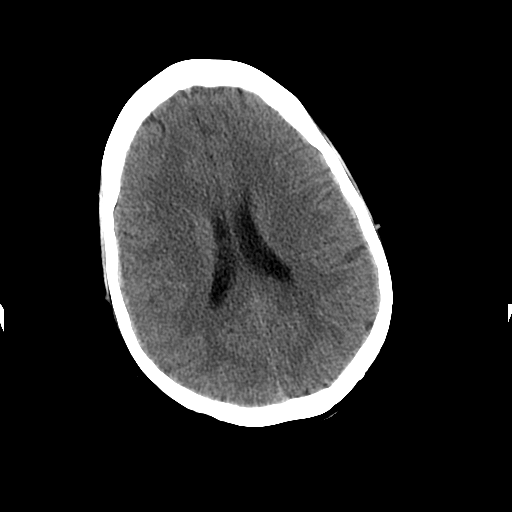
[im 18/31  brain]
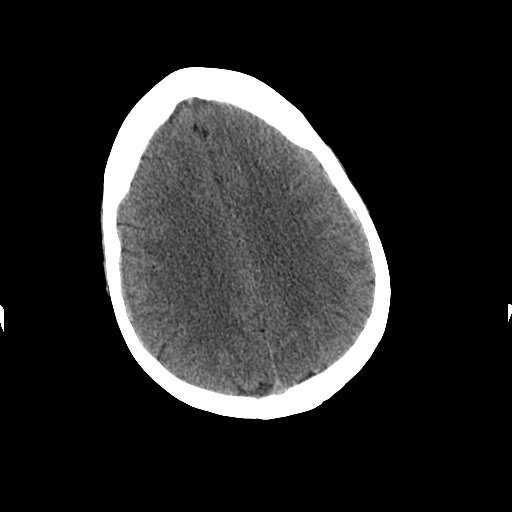
[im 20/31  brain]
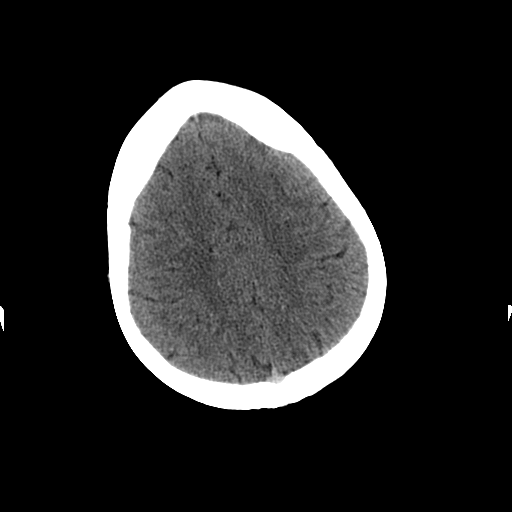
[im 20/31  bone]
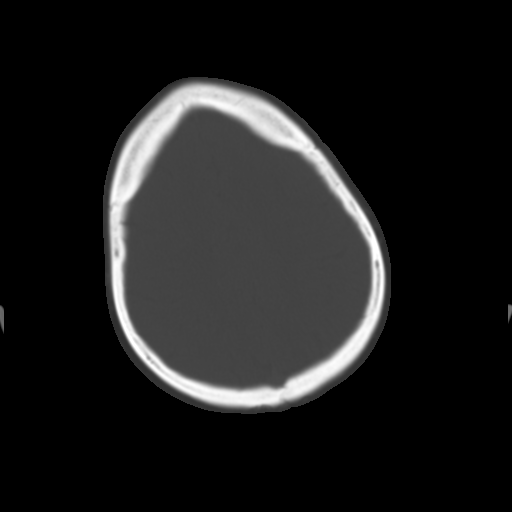
[im 22/31  brain]
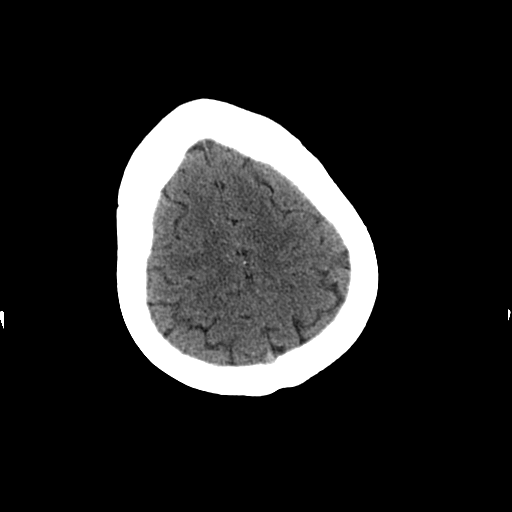
[im 24/31  brain]
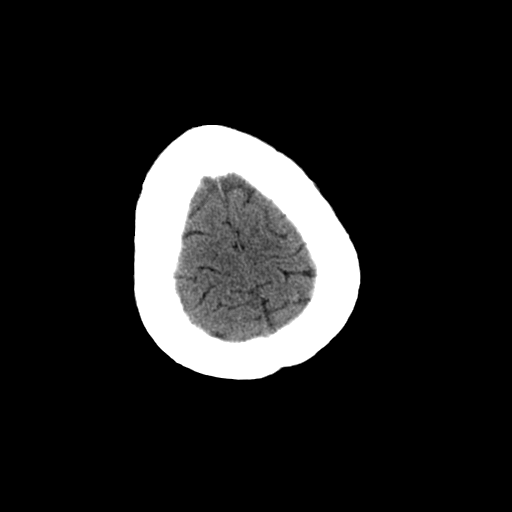
[im 26/31  brain]
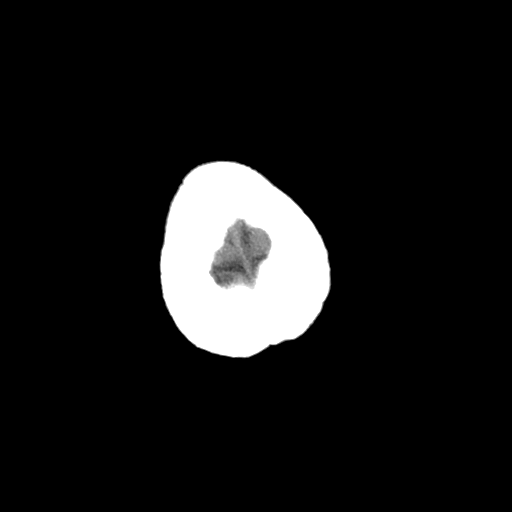
[im 28/31  brain]
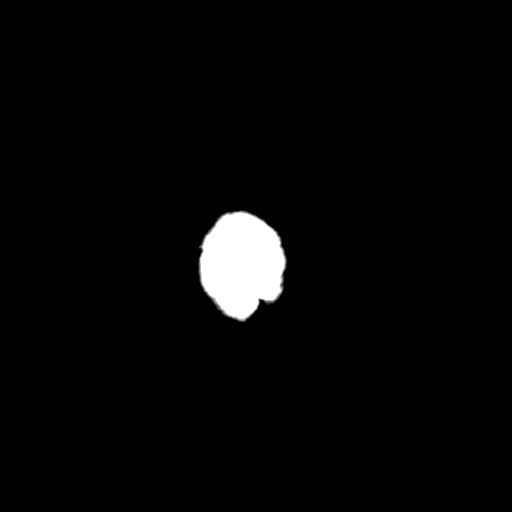
[im 28/31  bone]
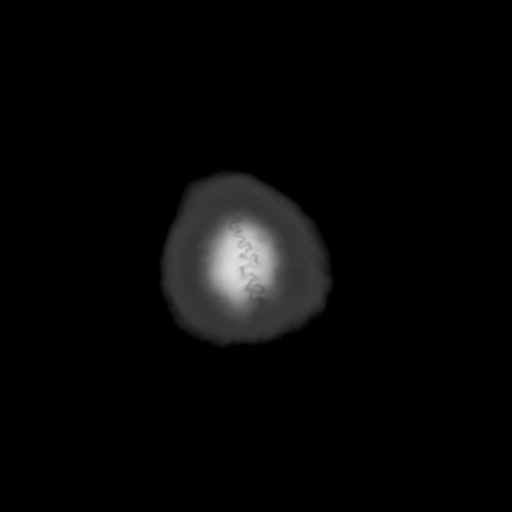

[Series 202: head w/o bone, idose (1) · axial · non-contrast · 0.49mm/px · z∈[+85,+125]mm · 3 of 31 slices shown]
[im 3/31  bone]
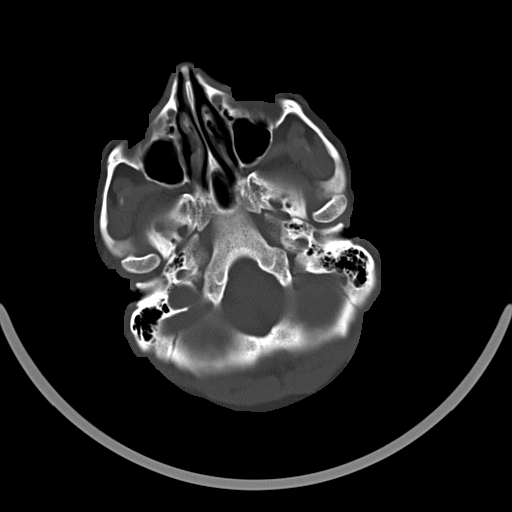
[im 7/31  bone]
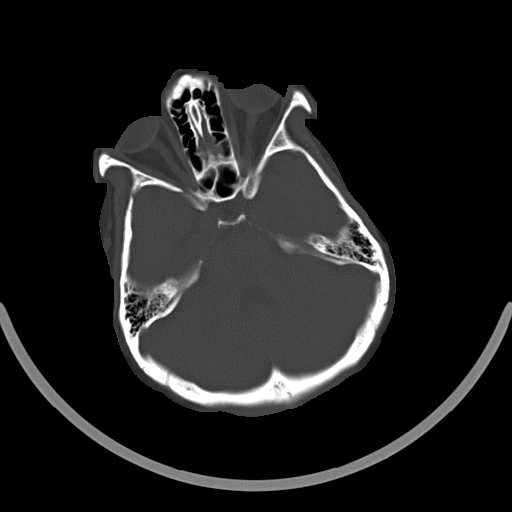
[im 11/31  bone]
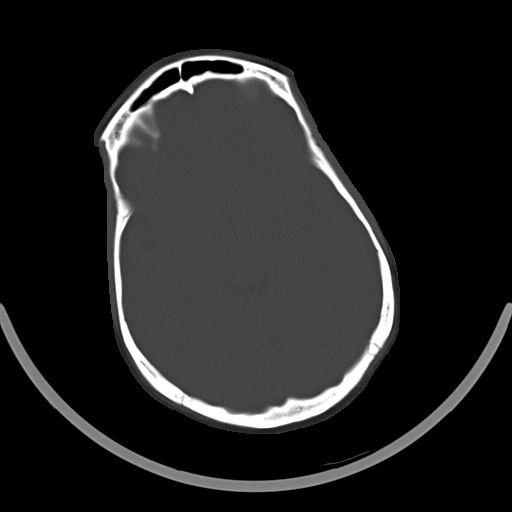

[16 of 30 positions shown; findings below may reference images not displayed]

FINDINGS: No skull fracture is noted. Paranasal sinuses shows nodular mucosal
thickening probable mucous retention cyst right maxillary sinus
measures about 8 mm.

No intracranial hemorrhage, mass effect or midline shift. No acute
cortical infarction. The gray and white-matter differentiation is
preserved. No mass lesion is noted on this unenhanced scan.
IMPRESSION: No acute intracranial abnormality. Probable mucous retention cyst
right maxillary sinus.

## 2015-03-17 ENCOUNTER — Encounter (HOSPITAL_COMMUNITY): Payer: Self-pay | Admitting: *Deleted

## 2015-05-02 ENCOUNTER — Emergency Department (HOSPITAL_COMMUNITY)
Admission: EM | Admit: 2015-05-02 | Discharge: 2015-05-02 | Disposition: A | Discharge status: Home / Self Care | Payer: Medicaid Other | Attending: Emergency Medicine | Admitting: Emergency Medicine

## 2015-05-02 ENCOUNTER — Encounter (HOSPITAL_COMMUNITY): Payer: Self-pay | Admitting: Emergency Medicine

## 2015-05-02 DIAGNOSIS — K029 Dental caries, unspecified: Secondary | ICD-10-CM | POA: Diagnosis not present

## 2015-05-02 DIAGNOSIS — Z91040 Latex allergy status: Secondary | ICD-10-CM | POA: Insufficient documentation

## 2015-05-02 DIAGNOSIS — I10 Essential (primary) hypertension: Secondary | ICD-10-CM | POA: Diagnosis not present

## 2015-05-02 DIAGNOSIS — Z8619 Personal history of other infectious and parasitic diseases: Secondary | ICD-10-CM | POA: Diagnosis not present

## 2015-05-02 DIAGNOSIS — K0889 Other specified disorders of teeth and supporting structures: Secondary | ICD-10-CM

## 2015-05-02 DIAGNOSIS — Z79899 Other long term (current) drug therapy: Secondary | ICD-10-CM | POA: Insufficient documentation

## 2015-05-02 DIAGNOSIS — Z87440 Personal history of urinary (tract) infections: Secondary | ICD-10-CM | POA: Diagnosis not present

## 2015-05-02 DIAGNOSIS — K002 Abnormalities of size and form of teeth: Secondary | ICD-10-CM | POA: Diagnosis not present

## 2015-05-02 DIAGNOSIS — Z8659 Personal history of other mental and behavioral disorders: Secondary | ICD-10-CM | POA: Diagnosis not present

## 2015-05-02 DIAGNOSIS — K088 Other specified disorders of teeth and supporting structures: Secondary | ICD-10-CM | POA: Insufficient documentation

## 2015-05-02 DIAGNOSIS — Z720 Tobacco use: Secondary | ICD-10-CM | POA: Insufficient documentation

## 2015-05-02 MED ORDER — PENICILLIN V POTASSIUM 500 MG PO TABS: 500.0000 mg | ORAL_TABLET | Freq: Two times a day (BID) | ORAL | Status: DC

## 2015-05-02 MED ORDER — OXYCODONE-ACETAMINOPHEN 5-325 MG PO TABS
1.0000 | ORAL_TABLET | Freq: Once | ORAL | Status: AC
  Administered 2015-05-02: 1 via ORAL
  Filled 2015-05-02: qty 1

## 2015-05-02 MED ORDER — BUPIVACAINE-EPINEPHRINE (PF) 0.5% -1:200000 IJ SOLN
1.8000 mL | Freq: Once | INTRAMUSCULAR | Status: AC
  Administered 2015-05-02: 1.8 mL
  Filled 2015-05-02: qty 1.8

## 2015-05-02 NOTE — ED Notes (Signed)
Dr. Gentry at the bedside.  

## 2015-05-02 NOTE — ED Notes (Signed)
Patient reports she is having dental pain in right lower side for the past 3 days. She ate porkchop tonight, and "must have bit wrong, cause now the pain goes all the way back to my neck". Reports taking off brand tylenol for pain management and gargling with alcoholic beverages.

## 2015-05-02 NOTE — Discharge Instructions (Signed)

## 2015-05-02 NOTE — ED Provider Notes (Signed)
CSN: [id number]     Arrival date & time 05/02/15  0447 History   First MD Initiated Contact with Patient 05/02/15 0449     Chief Complaint  Patient presents with  . Dental Pain     (Consider location/radiation/quality/duration/timing/severity/associated sxs/prior Treatment) Patient is a 34 y.o. female presenting with tooth pain.  Dental Pain Location:  Lower Lower teeth location:  31/RL 2nd molar Quality:  Aching Severity:  Moderate Onset quality:  Gradual Duration:  3 days Timing:  Constant Progression:  Worsening Chronicity:  New Context: dental caries   Relieved by:  Nothing Worsened by:  Hot food/drink, touching, jaw movement and pressure Associated symptoms: facial pain and facial swelling   Associated symptoms: no congestion, no fever and no trismus     Past Medical History  Diagnosis Date  . Preterm labor   . Depression   . Chlamydia   . Viral meningitis   . Abnormal Pap smear   . Headache(784.0)   . UTI (lower urinary tract infection)   . Hypertension   . Pregnancy induced hypertension   . Infection   . IUFD (intrauterine fetal death) 09/21/2004    Term IUFD at 40 weeks, unknown cause    Past Surgical History  Procedure Laterality Date  . Elective abortion     Family History  Problem Relation Age of Onset  . Cancer Mother   . Diabetes Mother   . Hyperlipidemia Mother   . Depression Mother   . Hypertension Mother   . Mental illness Mother   . Arthritis Maternal Grandmother   . Diabetes Maternal Grandmother   . Stroke Maternal Grandmother   . Diabetes Maternal Grandfather   . Hypertension Maternal Grandfather    History  Substance Use Topics  . Smoking status: Current Some Day Smoker  . Smokeless tobacco: Never Used  . Alcohol Use: No     Comment: Pt smokes "weed" everyday   OB History    Gravida Para Term Preterm AB TAB SAB Ectopic Multiple Living   15 7 5 2 7 4 3  0 0 6     Review of Systems  Constitutional: Negative for fever.  HENT:  Positive for facial swelling. Negative for congestion.   All other systems reviewed and are negative.     Allergies  Latex  Home Medications   Prior to Admission medications   Medication Sig Start Date End Date Taking? Authorizing Provider  HYDROcodone Bitartrate 10 MG C12A Take 1 tablet by mouth every 6 (six) hours as needed (Used for oral procedure.).    Historical Provider, MD  ibuprofen (ADVIL,MOTRIN) 600 MG tablet Take 600 mg by mouth every 6 (six) hours as needed for moderate pain.    Historical Provider, MD  ondansetron (ZOFRAN) 4 MG tablet Take 1 tablet (4 mg total) by mouth every 6 (six) hours. 05/13/14   Clement Sayres, MD  penicillin v potassium (VEETID) 500 MG tablet Take 1 tablet (500 mg total) by mouth 2 (two) times daily. X 7 days 05/02/15   Mirian Mo, MD  promethazine (PHENERGAN) 50 MG tablet Take 1 tablet (50 mg total) by mouth every 6 (six) hours as needed for nausea or vomiting. 05/13/14   Clement Sayres, MD   BP 148/93 mmHg  Pulse 60  Temp(Src) 99 F (37.2 C) (Oral)  Resp 19  Ht 5\' 6"  (1.676 m)  Wt 154 lb (69.854 kg)  BMI 24.87 kg/m2  SpO2 100%  LMP 04/11/2015 Physical Exam  Constitutional: She is oriented to person,  place, and time. She appears well-developed and well-nourished.  HENT:  Head: Normocephalic and atraumatic.  Right Ear: External ear normal.  Left Ear: External ear normal.  Poor dentition with dental caries, no periapical abscess  Eyes: Conjunctivae and EOM are normal. Pupils are equal, round, and reactive to light.  Neck: Normal range of motion. Neck supple.  Cardiovascular: Normal rate, regular rhythm, normal heart sounds and intact distal pulses.   Pulmonary/Chest: Effort normal and breath sounds normal.  Abdominal: Soft. Bowel sounds are normal. There is no tenderness.  Musculoskeletal: Normal range of motion.  Neurological: She is alert and oriented to person, place, and time.  Skin: Skin is warm and dry.  Vitals reviewed.   ED  Course  NERVE BLOCK Date/Time: 05/02/2015 5:23 AM Performed by: Mirian Mo Authorized by: Mirian Mo Consent: Verbal consent obtained. Indications: pain relief Body area: face/mouth Nerve: inferior alveolar Laterality: right Patient position: sitting Needle gauge: 25 G Location technique: anatomical landmarks Local anesthetic: bupivacaine 0.5% with epinephrine Outcome: pain improved Patient tolerance: Patient tolerated the procedure well with no immediate complications   (including critical care time) Labs Review Labs Reviewed - No data to display  Imaging Review No results found.   EKG Interpretation None      MDM   Final diagnoses:  Pain, dental    34 y.o. female without pertinent PMH presents with dental pain.  No systemic symptoms.  Pt well appearing, no trismus.  Has a dentist she will fu with today.  Dental block performed as above.  DC home in stable condition.    I have reviewed all laboratory and imaging studies if ordered as above  1. Pain, dental         Mirian Mo, MD 05/02/15 [phone]

## 2015-07-21 ENCOUNTER — Emergency Department (HOSPITAL_COMMUNITY)
Admission: EM | Admit: 2015-07-21 | Discharge: 2015-07-22 | Disposition: A | Discharge status: Home / Self Care | Payer: Medicaid Other | Attending: Emergency Medicine | Admitting: Emergency Medicine

## 2015-07-21 ENCOUNTER — Encounter (HOSPITAL_COMMUNITY): Payer: Self-pay | Admitting: Emergency Medicine

## 2015-07-21 DIAGNOSIS — N12 Tubulo-interstitial nephritis, not specified as acute or chronic: Secondary | ICD-10-CM

## 2015-07-21 DIAGNOSIS — F329 Major depressive disorder, single episode, unspecified: Secondary | ICD-10-CM | POA: Insufficient documentation

## 2015-07-21 DIAGNOSIS — Z720 Tobacco use: Secondary | ICD-10-CM | POA: Diagnosis not present

## 2015-07-21 DIAGNOSIS — N200 Calculus of kidney: Secondary | ICD-10-CM | POA: Diagnosis not present

## 2015-07-21 DIAGNOSIS — Z79899 Other long term (current) drug therapy: Secondary | ICD-10-CM | POA: Diagnosis not present

## 2015-07-21 DIAGNOSIS — K088 Other specified disorders of teeth and supporting structures: Secondary | ICD-10-CM | POA: Diagnosis not present

## 2015-07-21 DIAGNOSIS — Z91040 Latex allergy status: Secondary | ICD-10-CM | POA: Diagnosis not present

## 2015-07-21 DIAGNOSIS — Z87440 Personal history of urinary (tract) infections: Secondary | ICD-10-CM | POA: Insufficient documentation

## 2015-07-21 DIAGNOSIS — I10 Essential (primary) hypertension: Secondary | ICD-10-CM | POA: Insufficient documentation

## 2015-07-21 DIAGNOSIS — Z8619 Personal history of other infectious and parasitic diseases: Secondary | ICD-10-CM | POA: Diagnosis not present

## 2015-07-21 DIAGNOSIS — R109 Unspecified abdominal pain: Secondary | ICD-10-CM | POA: Diagnosis present

## 2015-07-21 DIAGNOSIS — K0889 Other specified disorders of teeth and supporting structures: Secondary | ICD-10-CM

## 2015-07-21 LAB — COMPREHENSIVE METABOLIC PANEL
ALT: 21 U/L (ref 14–54)
AST: 20 U/L (ref 15–41)
Albumin: 4.3 g/dL (ref 3.5–5.0)
Alkaline Phosphatase: 48 U/L (ref 38–126)
Anion gap: 10 (ref 5–15)
BUN: 9 mg/dL (ref 6–20)
CHLORIDE: 101 mmol/L (ref 101–111)
CO2: 26 mmol/L (ref 22–32)
CREATININE: 0.89 mg/dL (ref 0.44–1.00)
Calcium: 9.4 mg/dL (ref 8.9–10.3)
GFR calc non Af Amer: >60 mL/min (ref >60)
Glucose, Bld: 96 mg/dL (ref 65–99)
Potassium: 4.4 mmol/L (ref 3.5–5.1)
SODIUM: 137 mmol/L (ref 135–145)
Total Bilirubin: 0.1 mg/dL — ABNORMAL LOW (ref 0.3–1.2)
Total Protein: 7.3 g/dL (ref 6.5–8.1)

## 2015-07-21 LAB — URINALYSIS, ROUTINE W REFLEX MICROSCOPIC
Bilirubin Urine: NEGATIVE
Glucose, UA: NEGATIVE mg/dL
Ketones, ur: NEGATIVE mg/dL
NITRITE: NEGATIVE
Protein, ur: >300 mg/dL — AB
SPECIFIC GRAVITY, URINE: 1.015 (ref 1.005–1.030)
UROBILINOGEN UA: 0.2 mg/dL (ref 0.0–1.0)
pH: 6.5 (ref 5.0–8.0)

## 2015-07-21 LAB — CBC
HEMATOCRIT: 38.2 % (ref 36.0–46.0)
Hemoglobin: 12.6 g/dL (ref 12.0–15.0)
MCH: 29.4 pg (ref 26.0–34.0)
MCHC: 33 g/dL (ref 30.0–36.0)
MCV: 89 fL (ref 78.0–100.0)
PLATELETS: 285 10*3/uL (ref 150–400)
RBC: 4.29 MIL/uL (ref 3.87–5.11)
RDW: 13.6 % (ref 11.5–15.5)
WBC: 8.6 10*3/uL (ref 4.0–10.5)

## 2015-07-21 LAB — URINE MICROSCOPIC-ADD ON

## 2015-07-21 LAB — I-STAT BETA HCG BLOOD, ED (MC, WL, AP ONLY)

## 2015-07-21 LAB — LIPASE, BLOOD: LIPASE: 28 U/L (ref 22–51)

## 2015-07-21 MED ORDER — OXYCODONE-ACETAMINOPHEN 5-325 MG PO TABS
1.0000 | ORAL_TABLET | Freq: Once | ORAL | Status: AC
  Administered 2015-07-22: 1 via ORAL
  Filled 2015-07-21: qty 1

## 2015-07-21 MED ORDER — OXYCODONE-ACETAMINOPHEN 5-325 MG PO TABS
1.0000 | ORAL_TABLET | Freq: Once | ORAL | Status: AC
  Administered 2015-07-21: 1 via ORAL

## 2015-07-21 MED ORDER — SODIUM CHLORIDE 0.9 % IV BOLUS (SEPSIS)
1000.0000 mL | Freq: Once | INTRAVENOUS | Status: AC
  Administered 2015-07-22: 1000 mL via INTRAVENOUS

## 2015-07-21 MED ORDER — KETOROLAC TROMETHAMINE 30 MG/ML IJ SOLN
30.0000 mg | Freq: Once | INTRAMUSCULAR | Status: AC
  Administered 2015-07-22: 30 mg via INTRAVENOUS
  Filled 2015-07-21: qty 1

## 2015-07-21 MED ORDER — DEXTROSE 5 % IV SOLN
1.0000 g | Freq: Once | INTRAVENOUS | Status: AC
  Administered 2015-07-22: 1 g via INTRAVENOUS
  Filled 2015-07-21: qty 10

## 2015-07-21 MED ORDER — OXYCODONE-ACETAMINOPHEN 5-325 MG PO TABS
ORAL_TABLET | ORAL | Status: DC
Start: 2015-07-21 — End: 2015-07-22
  Filled 2015-07-21: qty 1

## 2015-07-21 NOTE — ED Notes (Signed)
Dr Wilkie Aye at bedside, agrees that pt can take her home meds, pt rocking back and forth, rolling eyes, reporting pain to face, dental pain, and pain in abd.

## 2015-07-21 NOTE — ED Notes (Signed)
Patient here with complaint of right flank pain with radiation towards right groin. Describes sensation of fullness and constant urination. Denies history of kidney stones and fevers.

## 2015-07-21 NOTE — ED Notes (Signed)
No reports right lower and upper dental pain.

## 2015-07-21 NOTE — ED Provider Notes (Signed)
CSN: [id number]     Arrival date & time 07/21/15  1854 History   This chart was scribed for Shon Baton, MD by Freida Busman, ED Scribe. This patient was seen in room [facility] and the patient's care was started 11:42 PM.    Chief Complaint  Patient presents with  . Flank Pain  . Dental Pain   The history is provided by the patient. No language interpreter was used.     HPI Comments:  Karina [name] is a 34 y.o. female who presents to the Emergency Department complaining of 7/10, constant right sided dental painfor ~1 week; states she needs to have a tooth pulled. She reports associated right sided facial pain, jaw and ear pain.    She also complains of 9/10 shooting right flank pain that radiates into her right abdomen. She states her pain started yesterday and waxes and wanes. She denies fever, nausea, and vomiting. She also denies h/o kidney stones. No alleviating factors noted. Reports urinary urgency. Denies any hematuria.   Past Medical History  Diagnosis Date  . Preterm labor   . Depression   . Chlamydia   . Viral meningitis   . Abnormal Pap smear   . Headache(784.0)   . UTI (lower urinary tract infection)   . Hypertension   . Pregnancy induced hypertension   . Infection   . IUFD (intrauterine fetal death) September 12, 2004    Term IUFD at 40 weeks, unknown cause    Past Surgical History  Procedure Laterality Date  . Elective abortion     Family History  Problem Relation Age of Onset  . Cancer Mother   . Diabetes Mother   . Hyperlipidemia Mother   . Depression Mother   . Hypertension Mother   . Mental illness Mother   . Arthritis Maternal Grandmother   . Diabetes Maternal Grandmother   . Stroke Maternal Grandmother   . Diabetes Maternal Grandfather   . Hypertension Maternal Grandfather    Social History  Substance Use Topics  . Smoking status: Current Some Day Smoker  . Smokeless tobacco: Never Used  . Alcohol Use: No     Comment: Pt smokes "weed"  everyday   OB History    Gravida Para Term Preterm AB TAB SAB Ectopic Multiple Living   0 0 6     Review of Systems  Constitutional: Negative for fever and chills.  HENT: Positive for dental problem. Negative for trouble swallowing and voice change.   Respiratory: Negative for cough, chest tightness and shortness of breath.   Cardiovascular: Negative for chest pain.  Gastrointestinal: Negative for nausea, vomiting and abdominal pain.  Genitourinary: Positive for urgency, frequency and flank pain. Negative for dysuria, vaginal bleeding and vaginal discharge.  Musculoskeletal: Negative for back pain.  Neurological: Negative for headaches.  All other systems reviewed and are negative.   Allergies  Latex  Home Medications   Prior to Admission medications   Medication Sig Start Date End Date Taking? Authorizing Provider  QUEtiapine (SEROQUEL) 25 MG tablet Take 25-50 tablets by mouth at bedtime.  06/10/15  Yes Historical Provider, MD  venlafaxine XR (EFFEXOR-XR) 37.5 MG 24 hr capsule Take 37.5 mg by mouth daily. 06/10/15  Yes Historical Provider, MD  ciprofloxacin (CIPRO) 500 MG tablet Take 1 tablet (500 mg total) by mouth every 12 (twelve) hours. 07/22/15   Shon Baton, MD  ibuprofen (ADVIL,MOTRIN) 600 MG tablet Take 1 tablet (600 mg total) by mouth every  6 (six) hours as needed. 07/22/15   Shon Baton, MD  ondansetron (ZOFRAN) 4 MG tablet Take 1 tablet (4 mg total) by mouth every 6 (six) hours. Patient not taking: Reported on 07/21/2015 05/13/14   Clement Sayres, MD  penicillin v potassium (VEETID) 500 MG tablet Take 1 tablet (500 mg total) by mouth 2 (two) times daily. X 7 days 07/22/15   Shon Baton, MD  promethazine (PHENERGAN) 50 MG tablet Take 1 tablet (50 mg total) by mouth every 6 (six) hours as needed for nausea or vomiting. Patient not taking: Reported on 07/21/2015 05/13/14   Clement Sayres, MD   BP 130/84 mmHg  Pulse 65  Temp(Src) 99 F (37.2 C) (Oral)   Resp 20  Ht 5\' 6"  (1.676 m)  Wt 156 lb (70.761 kg)  BMI 25.19 kg/m2  SpO2 100%  LMP 06/17/2015 (Exact Date)  Breastfeeding? No Physical Exam  Constitutional: She is oriented to person, place, and time. She appears well-developed and well-nourished. No distress.  HENT:  Head: Normocephalic and atraumatic.  Generally poor dentition, multiple fractured teeth, no obvious abscess, no facial swelling, patient able to lift tongue off the floor of the mouth with ease  Eyes: Pupils are equal, round, and reactive to light.  Cardiovascular: Normal rate, regular rhythm and normal heart sounds.   No murmur heard. Pulmonary/Chest: Effort normal and breath sounds normal. No respiratory distress. She has no wheezes.  Abdominal: Soft. Bowel sounds are normal. There is no tenderness. There is no rebound and no guarding.  Genitourinary:  Right CVA tenderness  Neurological: She is alert and oriented to person, place, and time.  Skin: Skin is warm and dry.  Psychiatric: She has a normal mood and affect.  Nursing note and vitals reviewed.   ED Course  Procedures   DIAGNOSTIC STUDIES:  Oxygen Saturation is 100% on RA, normal by my interpretation.    COORDINATION OF CARE:  11:49 PM Discussed treatment plan with pt at bedside and pt agreed to plan.  Labs Review Labs Reviewed  COMPREHENSIVE METABOLIC PANEL - Abnormal; Notable for the following:    Total Bilirubin 0.1 (*)    All other components within normal limits  URINALYSIS, ROUTINE W REFLEX MICROSCOPIC (NOT AT [facility]) - Abnormal; Notable for the following:    APPearance CLOUDY (*)    Hgb urine dipstick LARGE (*)    Protein, ur >300 (*)    Leukocytes, UA MODERATE (*)    All other components within normal limits  URINE MICROSCOPIC-ADD ON - Abnormal; Notable for the following:    Squamous Epithelial / LPF FEW (*)    All other components within normal limits  URINE CULTURE  CBC  LIPASE, BLOOD  I-STAT BETA HCG BLOOD, ED ([facility], [facility], [facility] ONLY)     Imaging Review Ct Renal Stone Study  07/22/2015   CLINICAL DATA:  Right flank pain radiating to the right abdomen. Gross hematuria.  EXAM: CT ABDOMEN AND PELVIS WITHOUT CONTRAST  TECHNIQUE: Multidetector CT imaging of the abdomen and pelvis was performed following the standard protocol without IV contrast.  COMPARISON:  None.  FINDINGS: Calcified granuloma in the left lung base.  Kidneys are symmetrical in size and shape. No hydronephrosis or hydroureter. No renal, ureteral, or bladder stones. No bladder wall thickening.  Cholelithiasis without inflammatory wall thickening or stranding around the gallbladder. No bile duct dilatation. The unenhanced appearance of the liver, spleen, pancreas, adrenal glands, abdominal aorta, inferior vena cava, and retroperitoneal lymph nodes is unremarkable. Stomach  and small bowel are decompressed. Stool-filled colon without abnormal distention. No free air or free fluid in the abdomen. Abdominal wall musculature appears intact.  Pelvis: Appendix is normal. Uterus and ovaries are not enlarged. No free or loculated pelvic fluid collections. No pelvic mass or lymphadenopathy. No destructive bone lesions.  IMPRESSION: No renal or ureteral stone or obstruction. Cholelithiasis. Normal appendix. No evidence of bowel obstruction.   Electronically Signed   By: Burman Nieves M.D.   On: 07/22/2015 02:22   I have personally reviewed and evaluated these images and lab results as part of my medical decision-making.   EKG Interpretation None      MDM   Final diagnoses:  Pain, dental  Pyelonephritis    Patient presents with 2 complaints. Regarding patient's dental pain, there is no obvious abscess or facial infection. Discussed with patient follow-up with the dentist for tooth extraction. We'll place on antibiotics and high-dose ibuprofen. Regarding patient's flank pain, urinalysis with too numerous to count white cells and red cells. Pain could be secondary to  pyelonephritis versus kidney stones. Other lab work including CBC is reassuring. She has been afebrile. CT stone study obtained and shows no evidence of stone. Will treat for pyelonephritis with ciprofloxacin. Urine culture sent. Patient able to tolerate food prior to discharge. Given strict return precautions.  After history, exam, and medical workup I feel the patient has been appropriately medically screened and is safe for discharge home. Pertinent diagnoses were discussed with the patient. Patient was given return precautions.  I personally performed the services described in this documentation, which was scribed in my presence. The recorded information has been reviewed and is accurate.    Shon Baton, MD 07/22/15 [phone]

## 2015-07-22 ENCOUNTER — Emergency Department (HOSPITAL_COMMUNITY): Payer: Medicaid Other

## 2015-07-22 MED ORDER — IBUPROFEN 600 MG PO TABS: 600.0000 mg | ORAL_TABLET | Freq: Four times a day (QID) | ORAL | Status: DC | PRN

## 2015-07-22 MED ORDER — PENICILLIN V POTASSIUM 500 MG PO TABS: 500.0000 mg | ORAL_TABLET | Freq: Two times a day (BID) | ORAL | Status: DC

## 2015-07-22 MED ORDER — CIPROFLOXACIN HCL 500 MG PO TABS: 500.0000 mg | ORAL_TABLET | Freq: Two times a day (BID) | ORAL | Status: DC

## 2015-07-22 NOTE — Discharge Instructions (Signed)
You were seen today for several complaints. It appears the have a urinary tract or kidney infection.  You'll be treated with antibiotics. There are no acute complications. See return precautions below. Regarding her dental pain, you should follow-up with a dentist. Resources provided below.  Dental Pain A tooth ache may be caused by cavities (tooth decay). Cavities expose the nerve of the tooth to air and hot or cold temperatures. It may come from an infection or abscess (also called a boil or furuncle) around your tooth. It is also often caused by dental caries (tooth decay). This causes the pain you are having. DIAGNOSIS  Your caregiver can diagnose this problem by exam. TREATMENT   If caused by an infection, it may be treated with medications which kill germs (antibiotics) and pain medications as prescribed by your caregiver. Take medications as directed.  Only take over-the-counter or prescription medicines for pain, discomfort, or fever as directed by your caregiver.  Whether the tooth ache today is caused by infection or dental disease, you should see your dentist as soon as possible for further care. SEEK MEDICAL CARE IF: The exam and treatment you received today has been provided on an emergency basis only. This is not a substitute for complete medical or dental care. If your problem worsens or new problems (symptoms) appear, and you are unable to meet with your dentist, call or return to this location. SEEK IMMEDIATE MEDICAL CARE IF:   You have a fever.  You develop redness and swelling of your face, jaw, or neck.  You are unable to open your mouth.  You have severe pain uncontrolled by pain medicine. MAKE SURE YOU:   Understand these instructions.  Will watch your condition.  Will get help right away if you are not doing well or get worse. Document Released: 10/29/2005 Document Revised: 01/21/2012 Document Reviewed: 06/16/2008 [facility] Patient Information 2015 [place],  [place]. This information is not intended to replace advice given to you by your health care provider. Make sure you discuss any questions you have with your health care provider. Pyelonephritis, Adult Pyelonephritis is a kidney infection. In general, there are 2 main types of pyelonephritis:  Infections that come on quickly without any warning (acute pyelonephritis).  Infections that persist for a long period of time (chronic pyelonephritis). CAUSES  Two main causes of pyelonephritis are:  Bacteria traveling from the bladder to the kidney. This is a problem especially in pregnant women. The urine in the bladder can become filled with bacteria from multiple causes, including:  Inflammation of the prostate gland (prostatitis).  Sexual intercourse in females.  Bladder infection (cystitis).  Bacteria traveling from the bloodstream to the tissue part of the kidney. Problems that may increase your risk of getting a kidney infection include:  Diabetes.  Kidney stones or bladder stones.  Cancer.  Catheters placed in the bladder.  Other abnormalities of the kidney or ureter. SYMPTOMS   Abdominal pain.  Pain in the side or flank area.  Fever.  Chills.  Upset stomach.  Blood in the urine (dark urine).  Frequent urination.  Strong or persistent urge to urinate.  Burning or stinging when urinating. DIAGNOSIS  Your caregiver may diagnose your kidney infection based on your symptoms. A urine sample may also be taken. TREATMENT  In general, treatment depends on how severe the infection is.   If the infection is mild and caught early, your caregiver may treat you with oral antibiotics and send you home.  If the infection is  more severe, the bacteria may have gotten into the bloodstream. This will require intravenous (IV) antibiotics and a hospital stay. Symptoms may include:  High fever.  Severe flank pain.  Shaking chills.  Even after a hospital stay, your caregiver may  require you to be on oral antibiotics for a period of time.  Other treatments may be required depending upon the cause of the infection. HOME CARE INSTRUCTIONS   Take your antibiotics as directed. Finish them even if you start to feel better.  Make an appointment to have your urine checked to make sure the infection is gone.  Drink enough fluids to keep your urine clear or pale yellow.  Take medicines for the bladder if you have urgency and frequency of urination as directed by your caregiver. SEEK IMMEDIATE MEDICAL CARE IF:   You have a fever or persistent symptoms for more than 2-3 days.  You have a fever and your symptoms suddenly get worse.  You are unable to take your antibiotics or fluids.  You develop shaking chills.  You experience extreme weakness or fainting.  There is no improvement after 2 days of treatment. MAKE SURE YOU:  Understand these instructions.  Will watch your condition.  Will get help right away if you are not doing well or get worse. Document Released: 10/29/2005 Document Revised: 04/29/2012 Document Reviewed: 04/04/2011 [facility] Patient Information 2015 [place], [place]. This information is not intended to replace advice given to you by your health care provider. Make sure you discuss any questions you have with your health care provider.    Emergency Department Resource Guide 1) Find a Doctor and Pay Out of Pocket Although you won't have to find out who is covered by your insurance plan, it is a good idea to ask around and get recommendations. You will then need to call the office and see if the doctor you have chosen will accept you as a new patient and what types of options they offer for patients who are self-pay. Some doctors offer discounts or will set up payment plans for their patients who do not have insurance, but you will need to ask so you aren't surprised when you get to your appointment.  2) Contact Your Local Health Department Not all  health departments have doctors that can see patients for sick visits, but many do, so it is worth a call to see if yours does. If you don't know where your local health department is, you can check in your phone book. The CDC also has a tool to help you locate your state's health department, and many state websites also have listings of all of their local health departments.  3) Find a Walk-in Clinic If your illness is not likely to be very severe or complicated, you may want to try a walk in clinic. These are popping up all over the country in pharmacies, drugstores, and shopping centers. They're usually staffed by nurse practitioners or physician assistants that have been trained to treat common illnesses and complaints. They're usually fairly quick and inexpensive. However, if you have serious medical issues or chronic medical problems, these are probably not your best option.  No Primary Care Doctor: - Call Health Connect at  [phone] - they can help you locate a primary care doctor that  accepts your insurance, provides certain services, etc. - Physician Referral Service- [phone]  Chronic Pain Problems: Organization         Address  Phone   Notes  [facility]  [phone] Patients need to be referred by their primary care doctor.   Medication Assistance: Organization         Address  Phone   Notes  [facility] Medication [facility] [address] [place], [place] [address] [phone] --Must be a resident of [facility] -- Must have NO insurance coverage whatsoever (no Medicaid/ Medicare, etc.) -- The pt. MUST have a primary care doctor that directs their care regularly and follows them in the community   MedAssist  [phone]   [employer]  [phone]    Agencies that provide inexpensive medical care: Organization         Address  Phone   Notes  [facility]  [phone]) [phone]   [facility]     [phone]) [phone]   [facility] [address] [place], [place] [address] [phone]   [facility] [address], [place] [phone](336) [phone]   [facility]anned Parenthood    [phone]   [facility]    [phone](336) [phone]   [facility]  [address], [place] Phone:  [phone], Fax:  [phone] Hours of Operation:  9 am - 6 pm, M-F.  Also accepts Medicaid/Medicare and self-pay.  [facility]  [address], [place] Phone: [phone]) [phone], Fax: [phone]. Hours of Operation:  8:30 am - 5:30 pm, M-F.  Also accepts Medicaid and self-pay.  [facility] [place] [address], [place] Phone: [phone]) [phone]   [facility] [address], [place], [place] [phone], Ext. [phone] Mondays & Thursdays: 7-9 AM.  First 15 patients are seen on a first come, first serve basis.    Medicaid-accepting [facility] Providers:  Organization         Address  Phone   Notes  [facility] [address], [place] [phone] Also accepts self-pay patients.  [facility] [address] [place], [place]  [phone]) [phone]   [facility] [address], [place] [phone]) [phone]   [facility] [address] [place] [address], [place] [phone]   Renaye RakersVeita Bland [address], [place]   [phone]) [phone] Only accepts [place] Access [place] patients after they have their name applied to their card.   Self-Pay (no insurance) in [facility]:  Organization         Address  Phone   Notes  Sickle Cell Patients, [facility] [address], [place] [phone]) [phone]   [facility] [address], [place] [phone]   [facility] [place]  [address], [place] [phone]) [phone]     [facility]/Dr. Osei-Bonsu  [address] [place] [address], [place] or [phone], [place] [phone] Phone number for both [place] and [place] locations is the same.  [facility] [address], [place] [phone]) [phone]   [facility] [place] [address] [place] [address], [place] or [address] [phone]   [facility] [address], [place] [phone], phone; [phone], fax Sees patients 1st and 3rd Saturday of every month.  Must not qualify for public or private insurance (i.e. Medicaid, Medicare, [place]  Health Choice, Veterans' Benefits)  Household income should be no more than 200% of the poverty level The clinic cannot treat you if you are pregnant or think you are pregnant  Sexually transmitted diseases are not treated at the clinic.    Dental Care: Organization         Address  Phone  Notes  [facility] of [facility] [address] [place], [place] [phone] Accepts children up to age 70 who are enrolled in [place] or [place] Health Choice; pregnant women with a Medicaid card; and children who have applied for Medicaid or [place] Health Choice, but were declined, whose parents can pay a reduced fee at time of service.  [place] [facility] of [facility]  [address], [place] [phone] Accepts children up to age 71 who are enrolled in [place] or [place] Health Choice; pregnant women with a Medicaid card; and children who have applied for Medicaid or [place] Health Choice, but were declined, whose parents can pay a reduced fee at time of service.  [facility]  [address] [place], [place] [phone] Patients are seen by appointment only. Walk-ins are not accepted. [facility] will see patients 69 years of age and older. Monday - Tuesday (8am-5pm) Most Wednesdays (8:30-5pm) $30 per visit,  cash only  [facility] Dental Access PROGRAM  [address], [facility] [phone] Patients are seen by appointment only. Walk-ins are not accepted. [facility] will see patients 49 years of age and older. One Wednesday Evening (Monthly: Volunteer Based).  $30 per visit, cash only  [employer]  [phone] for adults; Children under age 54, call Graduate Pediatric Dentistry at [phone]. Children aged 81-14, please call [phone] to request a pediatric application.  Dental services are provided in all areas of dental care including fillings, crowns and bridges, complete and partial dentures, implants, gum treatment, root canals, and extractions. Preventive care is also provided. Treatment is provided to both adults and children. Patients are selected via a lottery and there is often a waiting list.   [facility] [address] [place]  [phone] [email or web address]   [facility] [address] [place], [place] [phone], Ext. [phone] Second and Fourth Thursday of each month, opens at 6:30 AM; Clinic ends at 9 AM.  Patients are seen on a first-come first-served basis, and a limited number are seen during each clinic.   [facility]  [address] [place] [place], [place] [phone]   Eligibility Requirements You must have lived in [place], [place], or [place] counties for at least the last three months.   You cannot be eligible for state or federal sponsored [place], including [employer], [place], or [employer].   You generally cannot be eligible for healthcare insurance through your employer.    How to apply: Eligibility screenings are held every Tuesday and Wednesday afternoon from 1:00 pm until 4:00 pm. You do not need an appointment for the interview!  [facility] [address] [place], [place] [phone]   [facility] Health Department   [phone]   [facility] Health Department  [phone]   [facility] Health Department  [phone]    Behavioral Health Resources in the Community: Intensive Outpatient Programs Organization         Address  Phone  Notes  [facility] [phone]  [address] [place], [place] [phone]   [facility] Outpatient [address] [place], [place] [phone]   [facility] [address] [place], [place]  [phone]   [facility] [address]  [place], [place] [phone] or [phone]   Substance Abuse Resources Organization         Address  Phone  Notes  Al[facility] Drug Services  [phone]   [facility]  [phone]   [facility] [place]  [phone]   Floydene Flock  [phone]   Residential & Outpatient Substance Abuse Program  [phone]   Psychological Services Organization         Address  Phone  Notes  [facility]  [phone]   [facility]  [phone]   [facility] Mental Health [address] [place] [phone] or [phone]    Mobile Crisis Teams Organization         Address  Phone  Notes  [facility], Mobile Crisis Care Unit  [phone]   [facility]  [address] [place], [place] [phone]   Doristine Locks [address] [place] [phone]    Self-Help/Support Groups Organization         Address  Phone             Notes  [facility] [place] - variety of support groups  336- I7437963 Call for more information  Narcotics Anonymous (NA), [facility] [address], [employer] [place]  2 meetings at this location   Statistician         Address  Phone  Notes  [facility] [address],    [place]  [phone]   [facility]  [address] [place] [address]242, [place], [place] [phone]    [facility] [address] [place], [place] [phone] Admissions: 8am-3pm M-F  [facility] [address]    [place], [place] [phone]   [facility] [address] [place], [place], [place] [phone]   [facility] [address]  [place], [place] [phone]   [facility] - Intensive Outpatient [address], [place], [place] [phone]   [facility] ([facility] [address]  [place], [place] [phone] or [phone]   [facility] ([facility]) [address], [place], [place] [phone] Accepts Medicaid  [facility] [place] [address]  [place] [phone] Substance Abuse/Addiction Treatment   [facility]         Address  Phone  Notes  [facility]  [phone]   Angie Fava, PhD [address] Knack [place], [place]   [phone] or [phone]   [facility] Behavioral   [address] [place], [place] [phone]   [facility] [address], [place], [place] [phone] Insurance/Medicaid/sponsorship through [facility] and Families [address]                                    [place], [place] [phone] Therapy/tele-psych/case  [facility] [address], [place] [phone]    Dr. Lolly Mustache  [phone]   [facility] [address]  [employer] [facility] Dept. 1) [address],  [place] 2) [place] 3)  [place] [address], [place] [phone]  [phone]   [place] [phone] or [phone] (After Hours)

## 2015-07-24 LAB — URINE CULTURE: Culture: >=100000

## 2015-07-25 ENCOUNTER — Telehealth (HOSPITAL_COMMUNITY): Payer: Self-pay

## 2015-07-25 NOTE — Telephone Encounter (Signed)
Post ED Visit - Positive Culture Follow-up  Culture report reviewed by antimicrobial stewardship pharmacist:   Celedonio Miyamoto, Pharm.D., BCPS  Georgina Pillion, Pharm.D., BCPS  [place], [address]D., BCPS, AAHIVP  Estella Husk, Pharm.D., BCPS, AAHIVP  [place], [address]D.  Cassie Roseanne Ren[place], [place].D.  Positive urine culture Treated with cipro, organism sensitive to the same and no further patient follow-up is required at this time.  Ashley Jacobs 07/25/2015, 9:15 AM

## 2015-10-13 ENCOUNTER — Emergency Department (HOSPITAL_COMMUNITY)
Admission: EM | Admit: 2015-10-13 | Discharge: 2015-10-13 | Discharge status: Against Medical Advice | Payer: Medicaid Other | Attending: Emergency Medicine | Admitting: Emergency Medicine

## 2015-10-13 ENCOUNTER — Encounter (HOSPITAL_COMMUNITY): Payer: Self-pay

## 2015-10-13 DIAGNOSIS — I10 Essential (primary) hypertension: Secondary | ICD-10-CM | POA: Insufficient documentation

## 2015-10-13 DIAGNOSIS — R0602 Shortness of breath: Secondary | ICD-10-CM | POA: Diagnosis not present

## 2015-10-13 DIAGNOSIS — R05 Cough: Secondary | ICD-10-CM | POA: Diagnosis present

## 2015-10-13 NOTE — ED Notes (Signed)
Pt stated she was tired of waiting. Pt moved to off the floor at the time.

## 2015-10-13 NOTE — ED Notes (Signed)
Pt presents with 1 week h/o productive cough with green phlegm.  Pt reports her son has had same, has been taken OTC meds which has not same.  Pt reports cough is worse at night, pt reports having cramps in abdomen when she coughs.  Reports fever at onset of symptoms, denies now; +shortness of breath.

## 2016-05-23 IMAGING — CT CT RENAL STONE PROTOCOL
2 of 4 series · 17 of 46 positions shown, 19 images · non-contrast
Comparison: None.

CLINICAL DATA: Right flank pain radiating to the right abdomen.
Gross hematuria.

EXAM:
CT ABDOMEN AND PELVIS WITHOUT CONTRAST
TECHNIQUE: Multidetector CT imaging of the abdomen and pelvis was performed
following the standard protocol without IV contrast.

[Series 2: stone study 5.0 i30f 1 · axial · 0.68mm/px · z∈[-464,-54]mm · 14 of 90 slices shown, 16 images]
[im 4/90  soft-tissue]
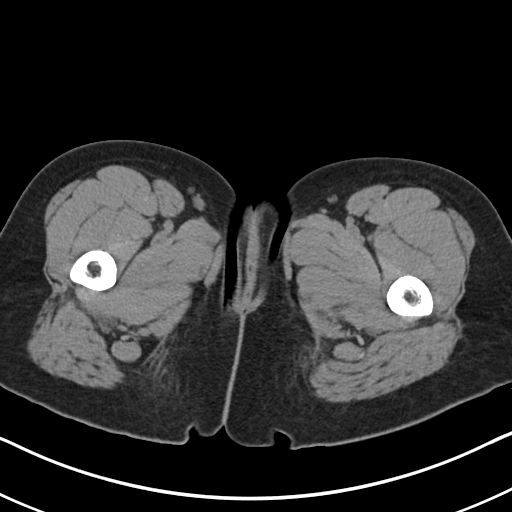
[im 4/90  bone]
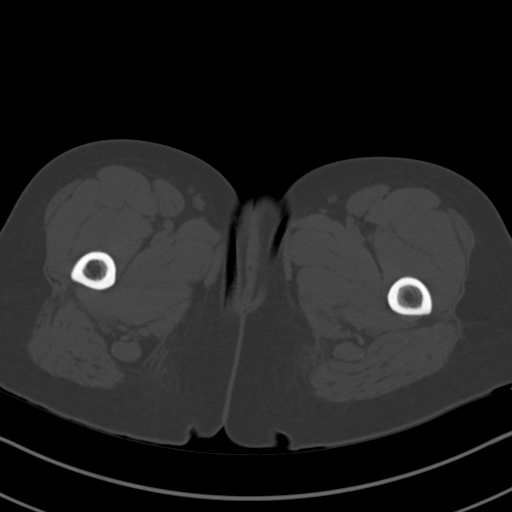
[im 12/90  soft-tissue]
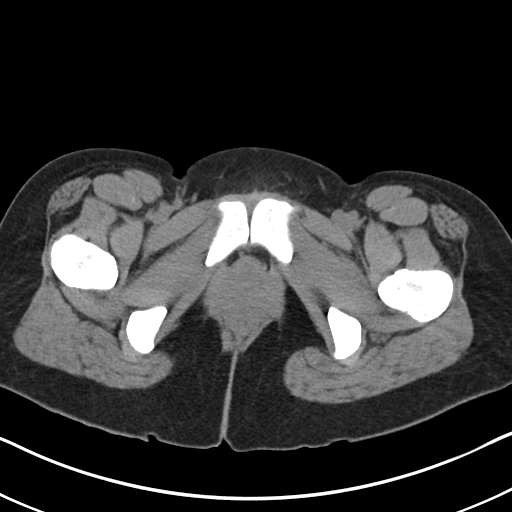
[im 16/90  soft-tissue]
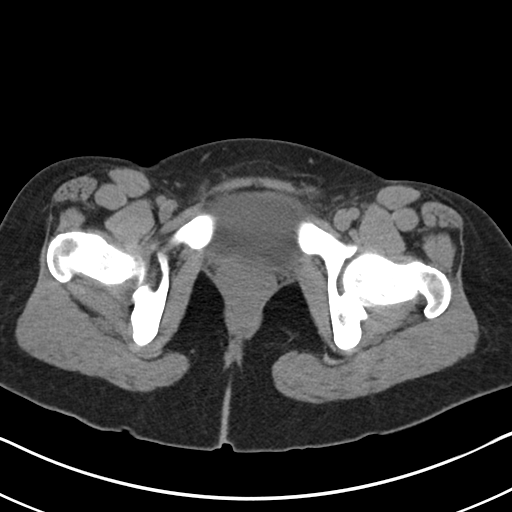
[im 24/90  soft-tissue]
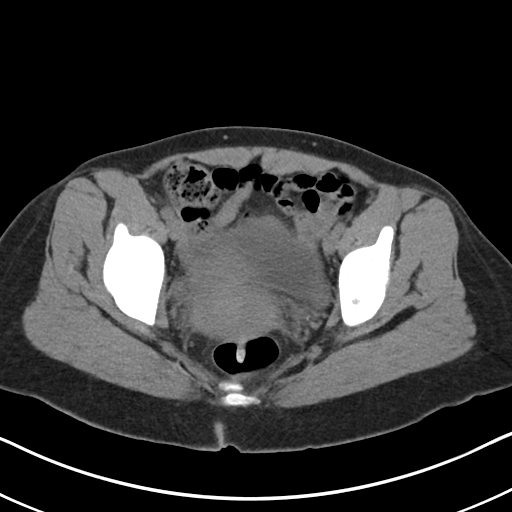
[im 31/90  soft-tissue]
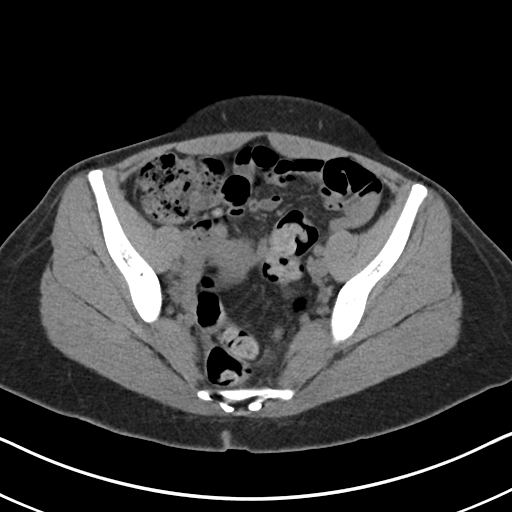
[im 35/90  soft-tissue]
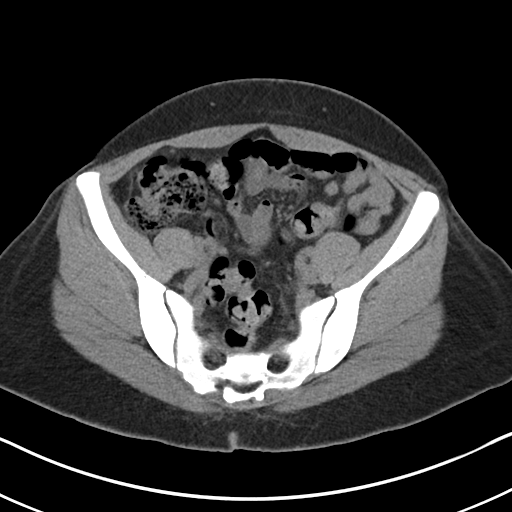
[im 43/90  soft-tissue]
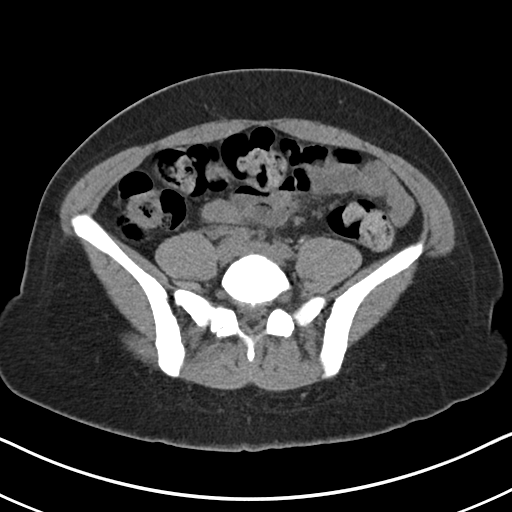
[im 47/90  soft-tissue]
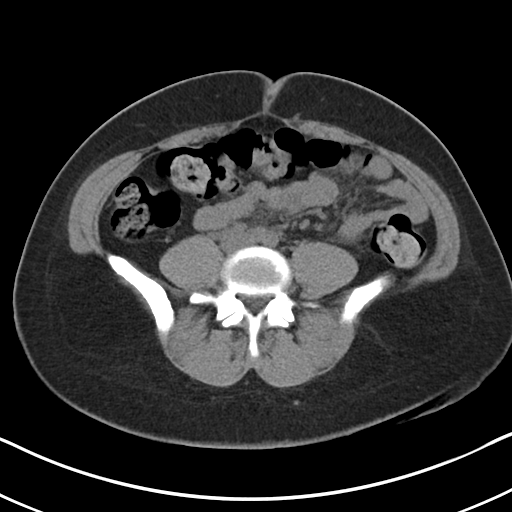
[im 55/90  soft-tissue]
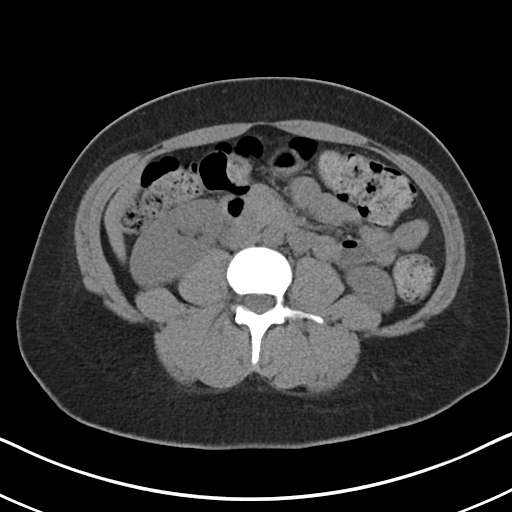
[im 55/90  bone]
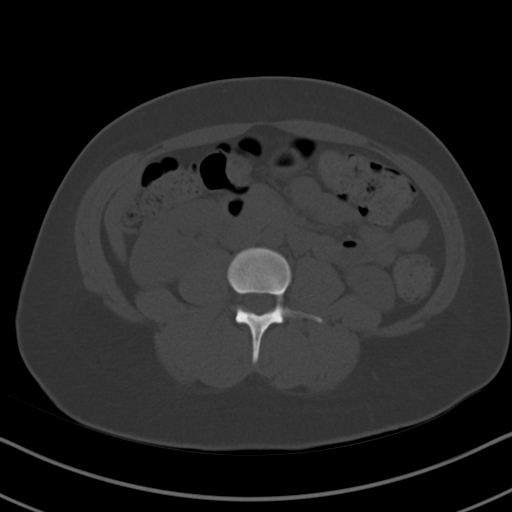
[im 59/90  soft-tissue]
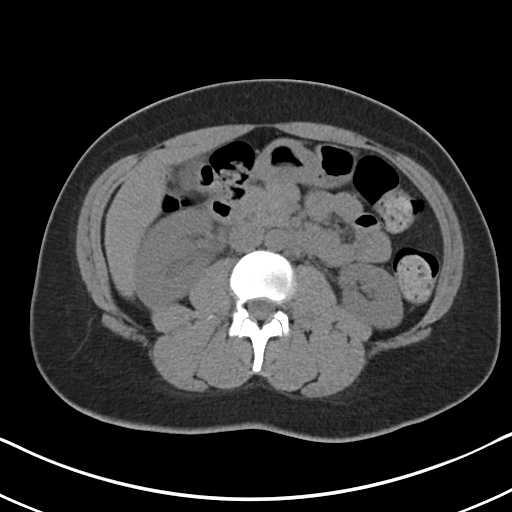
[im 66/90  soft-tissue]
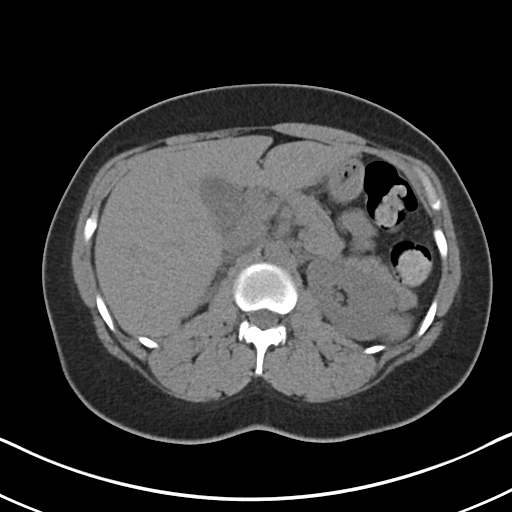
[im 74/90  soft-tissue]
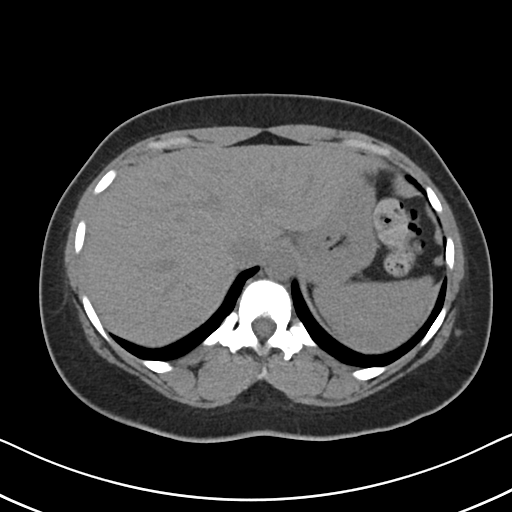
[im 78/90  soft-tissue]
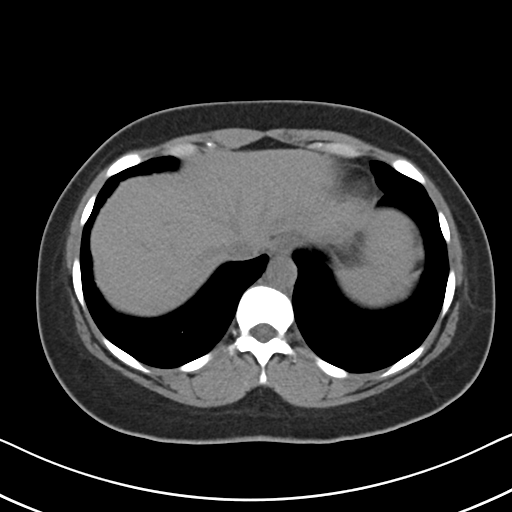
[im 86/90  soft-tissue]
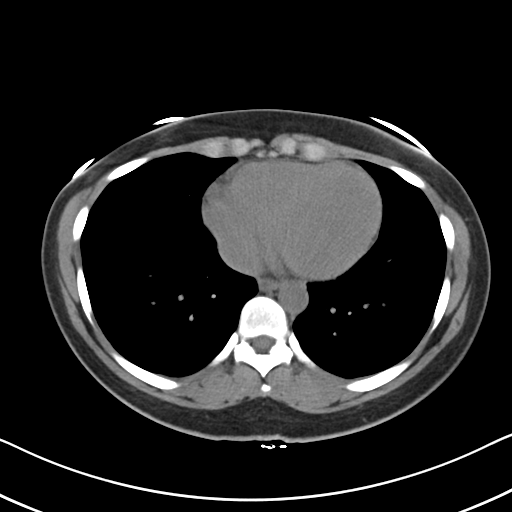

[Series 5: coronal soft tissue · coronal · 0.78mm/px · 3 of 84 slices shown]
[im 28/84  soft-tissue]
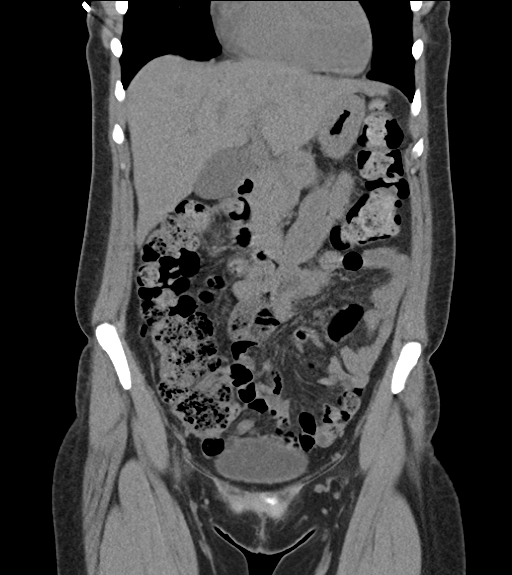
[im 37/84  soft-tissue]
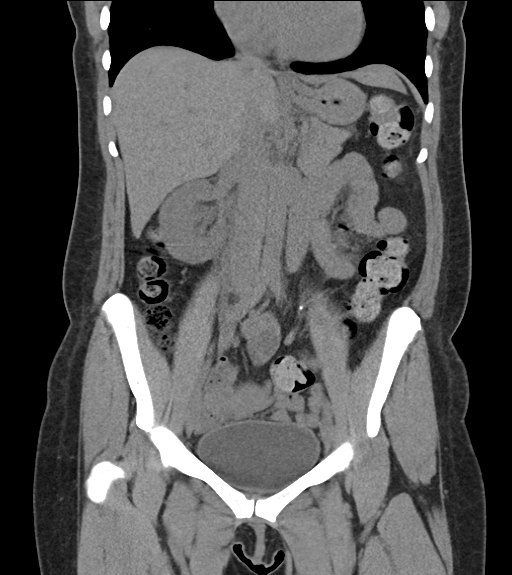
[im 47/84  soft-tissue]
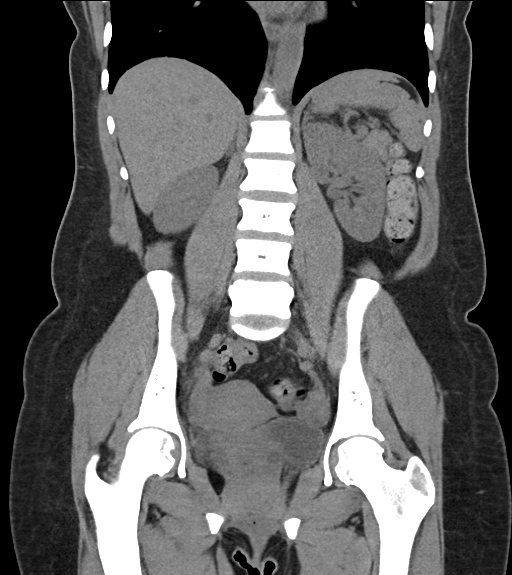

[17 of 46 positions shown; findings below may reference images not displayed]

FINDINGS: Calcified granuloma in the left lung base.

Kidneys are symmetrical in size and shape. No hydronephrosis or
hydroureter. No renal, ureteral, or bladder stones. No bladder wall
thickening.

Cholelithiasis without inflammatory wall thickening or stranding
around the gallbladder. No bile duct dilatation. The unenhanced
appearance of the liver, spleen, pancreas, adrenal glands, abdominal
aorta, inferior vena cava, and retroperitoneal lymph nodes is
unremarkable. Stomach and small bowel are decompressed. Stool-filled
colon without abnormal distention. No free air or free fluid in the
abdomen. Abdominal wall musculature appears intact.

Pelvis: Appendix is normal. Uterus and ovaries are not enlarged. No
free or loculated pelvic fluid collections. No pelvic mass or
lymphadenopathy. No destructive bone lesions.
IMPRESSION: No renal or ureteral stone or obstruction. Cholelithiasis. Normal
appendix. No evidence of bowel obstruction.

## 2016-06-12 ENCOUNTER — Encounter (HOSPITAL_COMMUNITY): Payer: Self-pay | Admitting: Emergency Medicine

## 2016-06-12 ENCOUNTER — Emergency Department (HOSPITAL_COMMUNITY)
Admission: EM | Admit: 2016-06-12 | Discharge: 2016-06-12 | Disposition: A | Discharge status: Home / Self Care | Payer: Medicaid Other | Attending: Emergency Medicine | Admitting: Emergency Medicine

## 2016-06-12 ENCOUNTER — Emergency Department (HOSPITAL_COMMUNITY): Payer: Medicaid Other

## 2016-06-12 DIAGNOSIS — Z91040 Latex allergy status: Secondary | ICD-10-CM | POA: Diagnosis not present

## 2016-06-12 DIAGNOSIS — K047 Periapical abscess without sinus: Secondary | ICD-10-CM | POA: Diagnosis not present

## 2016-06-12 DIAGNOSIS — I10 Essential (primary) hypertension: Secondary | ICD-10-CM | POA: Diagnosis not present

## 2016-06-12 DIAGNOSIS — K0889 Other specified disorders of teeth and supporting structures: Secondary | ICD-10-CM | POA: Diagnosis present

## 2016-06-12 DIAGNOSIS — F17210 Nicotine dependence, cigarettes, uncomplicated: Secondary | ICD-10-CM | POA: Insufficient documentation

## 2016-06-12 LAB — I-STAT CHEM 8, ED
BUN: 6 mg/dL (ref 6–20)
CALCIUM ION: 1.18 mmol/L (ref 1.13–1.30)
CHLORIDE: 103 mmol/L (ref 101–111)
CREATININE: 0.9 mg/dL (ref 0.44–1.00)
GLUCOSE: 109 mg/dL — AB (ref 65–99)
HCT: 37 % (ref 36.0–46.0)
Hemoglobin: 12.6 g/dL (ref 12.0–15.0)
Potassium: 3.6 mmol/L (ref 3.5–5.1)
Sodium: 143 mmol/L (ref 135–145)
TCO2: 26 mmol/L (ref 0–100)

## 2016-06-12 LAB — CBC
HEMATOCRIT: 36.1 % (ref 36.0–46.0)
Hemoglobin: 11.6 g/dL — ABNORMAL LOW (ref 12.0–15.0)
MCH: 28.6 pg (ref 26.0–34.0)
MCHC: 32.1 g/dL (ref 30.0–36.0)
MCV: 89.1 fL (ref 78.0–100.0)
PLATELETS: 331 10*3/uL (ref 150–400)
RBC: 4.05 MIL/uL (ref 3.87–5.11)
RDW: 13.6 % (ref 11.5–15.5)
WBC: 8.4 10*3/uL (ref 4.0–10.5)

## 2016-06-12 LAB — I-STAT BETA HCG BLOOD, ED (MC, WL, AP ONLY)

## 2016-06-12 MED ORDER — CLINDAMYCIN HCL 150 MG PO CAPS: 450.0000 mg | ORAL_CAPSULE | Freq: Three times a day (TID) | ORAL | 0 refills | Status: DC

## 2016-06-12 MED ORDER — IOPAMIDOL (ISOVUE-300) INJECTION 61%
INTRAVENOUS | Status: AC
  Administered 2016-06-12: 75 mL
  Filled 2016-06-12: qty 75

## 2016-06-12 MED ORDER — MORPHINE SULFATE (PF) 4 MG/ML IV SOLN
4.0000 mg | Freq: Once | INTRAVENOUS | Status: AC
  Administered 2016-06-12: 4 mg via INTRAVENOUS
  Filled 2016-06-12: qty 1

## 2016-06-12 MED ORDER — CLINDAMYCIN PHOSPHATE 600 MG/50ML IV SOLN
600.0000 mg | Freq: Once | INTRAVENOUS | Status: AC
  Administered 2016-06-12: 600 mg via INTRAVENOUS
  Filled 2016-06-12: qty 50

## 2016-06-12 NOTE — ED Provider Notes (Signed)
[facility]-EMERGENCY DEPT Provider Note   CSN: [id number] Arrival date & time: 06/12/16  1827  First Provider Contact:  First MD Initiated Contact with Patient 06/12/16 1857     History   Chief Complaint Chief Complaint  Patient presents with  . Dental Pain   HPI Karina [name] is a 35 y.o. female.  HPI 35 y.o. female presents to the Emergency Department today complaining of left dental abscess x 1 week. Has hx of dental abscess on right side. Noted increased swelling and pain for the past few days. Has increase in discomfort with PO intake and swallowing. States pain is 10/10 and throbbing. Noted low grade fever at home. Has appointment with dentist on Thursday. No neck pain/stiffness. Able to tolerate PO, but painful. No N/V. No CP/SOB/ABD pain. No other symptoms noted. Has dentist appointment on Thursday.     Past Medical History:  Diagnosis Date  . Abnormal Pap smear   . Chlamydia   . Depression   . Headache(784.0)   . Hypertension   . Infection   . IUFD (intrauterine fetal death) 2004/09/19   Term IUFD at 40 weeks, unknown cause   . Pregnancy induced hypertension   . Preterm labor   . UTI (lower urinary tract infection)   . Viral meningitis     Patient Active Problem List   Diagnosis Date Noted  . Preterm premature rupture of membranes (PPROM) delivered, current hospitalization 10/21/2012  . Premature delivery 10/21/2012    Past Surgical History:  Procedure Laterality Date  . elective abortion      OB History    Gravida Para Term Preterm AB Living   15 7 5 2 7 6    SAB TAB Ectopic Multiple Live Births   3 4 0 0       Home Medications    Prior to Admission medications   Medication Sig Start Date End Date Taking? Authorizing Provider  ciprofloxacin (CIPRO) 500 MG tablet Take 1 tablet (500 mg total) by mouth every 12 (twelve) hours. 07/22/15   Shon Baton, MD  ibuprofen (ADVIL,MOTRIN) 600 MG tablet Take 1 tablet (600 mg total) by mouth every 6 (six) hours as  needed. 07/22/15   Shon Baton, MD  ondansetron (ZOFRAN) 4 MG tablet Take 1 tablet (4 mg total) by mouth every 6 (six) hours. Patient not taking: Reported on 07/21/2015 05/13/14   Clement Sayres, MD  penicillin v potassium (VEETID) 500 MG tablet Take 1 tablet (500 mg total) by mouth 2 (two) times daily. X 7 days 07/22/15   Shon Baton, MD  promethazine (PHENERGAN) 50 MG tablet Take 1 tablet (50 mg total) by mouth every 6 (six) hours as needed for nausea or vomiting. Patient not taking: Reported on 07/21/2015 05/13/14   Clement Sayres, MD  QUEtiapine (SEROQUEL) 25 MG tablet Take 25-50 tablets by mouth at bedtime.  06/10/15   Historical Provider, MD  venlafaxine XR (EFFEXOR-XR) 37.5 MG 24 hr capsule Take 37.5 mg by mouth daily. 06/10/15   Historical Provider, MD    Family History Family History  Problem Relation Age of Onset  . Cancer Mother   . Diabetes Mother   . Hyperlipidemia Mother   . Depression Mother   . Hypertension Mother   . Mental illness Mother   . Arthritis Maternal Grandmother   . Diabetes Maternal Grandmother   . Stroke Maternal Grandmother   . Diabetes Maternal Grandfather   . Hypertension Maternal Grandfather     Social History Social History  Substance Use Topics  . Smoking status: Current Some Day Smoker    Packs/day: 5.00    Years: 10.00    Types: Cigars  . Smokeless tobacco: Never Used  . Alcohol use No     Comment: Pt smokes "weed" everyday    Allergies   Latex   Review of Systems Review of Systems ROS reviewed and all are negative for acute change except as noted in the HPI.  Physical Exam Updated Vital Signs BP (!) 157/114 (BP Location: Left Arm)   Pulse 82   Temp 100.1 F (37.8 C) (Oral)   Resp 20   LMP 06/01/2016   SpO2 100%   Physical Exam  Constitutional: She is oriented to person, place, and time. Vital signs are normal. She appears well-developed and well-nourished.  HENT:  Head: Normocephalic.  Right Ear: Hearing normal.  Left  Ear: Hearing normal.  Mouth/Throat: Uvula is midline, oropharynx is clear and moist and mucous membranes are normal. No trismus in the jaw. Dental abscesses and dental caries present. No uvula swelling. No posterior oropharyngeal edema or posterior oropharyngeal erythema.  Sizeable swelling left side. Dental abscess noted on left lower jaw. Dental caries noted on left side. TTP submandibular.   Eyes: Conjunctivae and EOM are normal. Pupils are equal, round, and reactive to light.  Neck: Trachea normal, normal range of motion, full passive range of motion without pain and phonation normal. Neck supple.  Cardiovascular: Normal rate and regular rhythm.   Pulmonary/Chest: Effort normal.  Neurological: She is alert and oriented to person, place, and time.  Skin: Skin is warm and dry.  Psychiatric: She has a normal mood and affect. Her speech is normal and behavior is normal. Thought content normal.   ED Treatments / Results  Labs (all labs ordered are listed, but only abnormal results are displayed) Labs Reviewed  CBC - Abnormal; Notable for the following:       Result Value   Hemoglobin 11.6 (*)    All other components within normal limits  I-STAT CHEM 8, ED - Abnormal; Notable for the following:    Glucose, Bld 109 (*)    All other components within normal limits  I-STAT BETA HCG BLOOD, ED ([facility], [facility], [facility] ONLY)    EKG  EKG Interpretation None      Radiology Ct Soft Tissue Neck W Contrast  Result Date: 06/12/2016 CLINICAL DATA:  35 year old female with left-sided dental pain and swelling. Left facial swelling. Initial encounter. EXAM: CT NECK WITH CONTRAST TECHNIQUE: Multidetector CT imaging of the neck was performed using the standard protocol following the bolus administration of intravenous contrast. CONTRAST:  75mL ISOVUE-300 IOPAMIDOL (ISOVUE-300) INJECTION 61% COMPARISON:  05/12/2014 head CT.  No comparison neck CT. FINDINGS: Pharynx and larynx: Symmetric mild prominence of the  palatine tonsils and fossa of Rosenmuller. Salivary glands: No primary salivary gland abnormality. Thyroid: No worrisome thyroid lesion. Lymph nodes: Adenopathy throughout the neck greatest on the left adjacent to the below described inflammatory process. Vascular: No septic thrombosis of the internal jugular vein. Limited intracranial: Negative. Visualized orbits: Negative. Mastoids and visualized paranasal sinuses: Minimal polypoid opacification superior medial right. Skeleton: Significant caries. This includes left lower premolar and molars, right lower molars and upper right incisor/premolar. Secondary to dental disease, there is breakthrough of the cortex of the mandible (lateral margin) at the left lower molar region with extension of inflammatory process into overlying soft tissue. I suspect this represents combination of an abscess and cellulitis (however, it is difficult to  define well-marginated drainable collection at this level). Upper chest: No worrisome abnormality. IMPRESSION: Significant caries. Secondary to dental disease, there is breakthrough of the cortex of the mandible (lateral margin) at the left lower molar region with extension of inflammatory process into overlying soft tissue. I suspect this represents combination of an abscess and cellulitis (however, it is difficult to define well-marginated drainable collection at this level). Adenopathy throughout the neck greatest on the left. Electronically Signed   By: Lacy Duverney M.D.   On: 06/12/2016 20:41    Procedures Procedures (including critical care time)  Medications Ordered in ED Medications - No data to display   Initial Impression / Assessment and Plan / ED Course  I have reviewed the triage vital signs and the nursing notes.  Pertinent labs & imaging results that were available during my care of the patient were reviewed by me and considered in my medical decision making (see chart for details).  Clinical Course    Final Clinical Impressions(s) / ED Diagnoses  I have reviewed and evaluated the relevant laboratory values I have reviewed and evaluated the relevant imaging studies.  I have reviewed the relevant previous healthcare records. I obtained HPI from historian. Patient discussed with supervising physician  ED Course:  Assessment: Pt is a 35yF who presents with left sided dental abscess x 1 week. Worsening. Difficulty with PO intake. On exam, pt in NAD. Nontoxic/nonseptic appearing. VSS. Low grade temp. Lungs CTA. Heart RRR. Sizeable swelling of left lower jaw. TTP submandibular region. Concern for Ludwigs. ROM of neck intact without discomfort. No trismus. CBC with no leukocytosis. CT with abscess left mandibular region. No ludwigs angina. Given Clindamycin IV in ED. Given analgesia. Plan is to DC home with Clinda and follow up with dentist. At time of discharge, Patient is in no acute distress. Vital Signs are stable. Patient is able to ambulate. Patient able to tolerate PO.    Disposition/Plan:  DC Home Additional Verbal discharge instructions given and discussed with patient.  Pt Instructed to f/u with Dentist at appointment on Thursday for evaluation and treatment of symptoms. Return precautions given Pt acknowledges and agrees with plan  Supervising Physician Margarita Grizzle, MD   Final diagnoses:  Dental abscess    New Prescriptions New Prescriptions   No medications on file     Audry Pili, PA-C 06/12/16 2106    Margarita Grizzle, MD 06/16/16 [phone]

## 2016-06-12 NOTE — ED Notes (Signed)
Patient able to ambulate independently  

## 2016-06-12 NOTE — ED Triage Notes (Signed)
Pt presents to ED for assessment of left sided dental pain/swelling.  Significant swelling noted to the left side of face, pt denies difficulty breathing, but sts swallowing/eating is difficult.  Pt sts she feels the gums are swelling.

## 2016-06-12 NOTE — Discharge Instructions (Signed)
Please read and follow all provided instructions.  Your diagnoses today include:  1. Dental abscess    Tests performed today include: Vital signs. See below for your results today.   Medications prescribed:  Take as prescribed   Home care instructions:  Follow any educational materials contained in this packet.  Follow-up instructions: Please follow-up with your Dentist for further evaluation of symptoms and treatment   Return instructions:  Please return to the Emergency Department if you do not get better, if you get worse, or new symptoms OR  - Fever (temperature greater than 101.18F)  - Bleeding that does not stop with holding pressure to the area    -Severe pain (please note that you may be more sore the day after your accident)  - Chest Pain  - Difficulty breathing  - Severe nausea or vomiting  - Inability to tolerate food and liquids  - Passing out  - Skin becoming red around your wounds  - Change in mental status (confusion or lethargy)  - New numbness or weakness    Please return if you have any other emergent concerns.  Additional Information:  Your vital signs today were: BP (!) 146/106 (BP Location: Left Arm)    Pulse 92    Temp 100.2 F (37.9 C) (Oral)    Resp 12    LMP 06/01/2016    SpO2 100%  If your blood pressure (BP) was elevated above 135/85 this visit, please have this repeated by your doctor within one month. ---------------

## 2016-06-12 NOTE — ED Notes (Signed)
This RN attempted IV x 2 without success.  Will seek additional assistance.   

## 2017-09-23 ENCOUNTER — Encounter: Payer: Self-pay | Admitting: Obstetrics & Gynecology

## 2017-10-23 ENCOUNTER — Encounter: Payer: Self-pay | Admitting: Obstetrics & Gynecology

## 2018-08-05 ENCOUNTER — Encounter (HOSPITAL_COMMUNITY): Payer: Self-pay | Admitting: Emergency Medicine

## 2018-08-05 ENCOUNTER — Other Ambulatory Visit: Payer: Self-pay

## 2018-08-05 ENCOUNTER — Emergency Department (HOSPITAL_COMMUNITY)
Admission: EM | Admit: 2018-08-05 | Discharge: 2018-08-06 | Disposition: A | Discharge status: Home / Self Care | Payer: Self-pay | Attending: Emergency Medicine | Admitting: Emergency Medicine

## 2018-08-05 DIAGNOSIS — F17290 Nicotine dependence, other tobacco product, uncomplicated: Secondary | ICD-10-CM | POA: Insufficient documentation

## 2018-08-05 DIAGNOSIS — I10 Essential (primary) hypertension: Secondary | ICD-10-CM | POA: Insufficient documentation

## 2018-08-05 DIAGNOSIS — R197 Diarrhea, unspecified: Secondary | ICD-10-CM | POA: Insufficient documentation

## 2018-08-05 DIAGNOSIS — R1110 Vomiting, unspecified: Secondary | ICD-10-CM | POA: Insufficient documentation

## 2018-08-05 DIAGNOSIS — H9202 Otalgia, left ear: Secondary | ICD-10-CM | POA: Insufficient documentation

## 2018-08-05 DIAGNOSIS — Z91040 Latex allergy status: Secondary | ICD-10-CM | POA: Insufficient documentation

## 2018-08-05 DIAGNOSIS — F1210 Cannabis abuse, uncomplicated: Secondary | ICD-10-CM | POA: Insufficient documentation

## 2018-08-05 DIAGNOSIS — B349 Viral infection, unspecified: Secondary | ICD-10-CM

## 2018-08-05 LAB — CBC
HEMATOCRIT: 39.8 % (ref 36.0–46.0)
HEMOGLOBIN: 12.4 g/dL (ref 12.0–15.0)
MCH: 29.2 pg (ref 26.0–34.0)
MCHC: 31.2 g/dL (ref 30.0–36.0)
MCV: 93.9 fL (ref 78.0–100.0)
Platelets: 279 10*3/uL (ref 150–400)
RBC: 4.24 MIL/uL (ref 3.87–5.11)
RDW: 13.2 % (ref 11.5–15.5)
WBC: 5.7 10*3/uL (ref 4.0–10.5)

## 2018-08-05 LAB — COMPREHENSIVE METABOLIC PANEL
ALBUMIN: 3.7 g/dL (ref 3.5–5.0)
ALT: 16 U/L (ref 0–44)
ANION GAP: 9 (ref 5–15)
AST: 19 U/L (ref 15–41)
Alkaline Phosphatase: 43 U/L (ref 38–126)
BILIRUBIN TOTAL: 0.4 mg/dL (ref 0.3–1.2)
BUN: 7 mg/dL (ref 6–20)
CO2: 27 mmol/L (ref 22–32)
Calcium: 9.3 mg/dL (ref 8.9–10.3)
Chloride: 107 mmol/L (ref 98–111)
Creatinine, Ser: 1.06 mg/dL — ABNORMAL HIGH (ref 0.44–1.00)
GFR calc Af Amer: >60 mL/min (ref >60)
Glucose, Bld: 90 mg/dL (ref 70–99)
POTASSIUM: 4 mmol/L (ref 3.5–5.1)
Sodium: 143 mmol/L (ref 135–145)
TOTAL PROTEIN: 6.7 g/dL (ref 6.5–8.1)

## 2018-08-05 LAB — URINALYSIS, ROUTINE W REFLEX MICROSCOPIC: RBC / HPF: >50 RBC/hpf — ABNORMAL HIGH (ref 0–5)

## 2018-08-05 LAB — I-STAT BETA HCG BLOOD, ED (MC, WL, AP ONLY)

## 2018-08-05 LAB — GROUP A STREP BY PCR: Group A Strep by PCR: NOT DETECTED

## 2018-08-05 LAB — LIPASE, BLOOD: Lipase: 41 U/L (ref 11–51)

## 2018-08-05 NOTE — ED Triage Notes (Addendum)
Pt reports sore throat, bilateral ear ache and diarrhea that started Sunday. Also reports N/V and LLQ abd pain that radiates to her back. Pt reports fever of 103.64F at home Sunday night. Pt has been taking otc medications with some relief.

## 2018-08-06 MED ORDER — SODIUM CHLORIDE 0.9 % IV BOLUS
2000.0000 mL | Freq: Once | INTRAVENOUS | Status: AC
  Administered 2018-08-06: 2000 mL via INTRAVENOUS

## 2018-08-06 MED ORDER — PROMETHAZINE HCL 25 MG PO TABS: 25.0000 mg | ORAL_TABLET | Freq: Four times a day (QID) | ORAL | 0 refills | Status: DC | PRN

## 2018-08-06 MED ORDER — DICYCLOMINE HCL 20 MG PO TABS: 20.0000 mg | ORAL_TABLET | Freq: Two times a day (BID) | ORAL | 0 refills | Status: DC | PRN

## 2018-08-06 NOTE — Discharge Instructions (Signed)
Your work-up in the emergency department was reassuring.  We suspect that your symptoms are due to a viral illness.  The should resolve on their own in the next few days.  We advise that you continue to drink plenty of clear liquids to prevent dehydration.  You may use Phenergan for any persistent nausea as well as Bentyl for abdominal pain or cramping.  Drink plenty of clear liquids to prevent dehydration.  Follow-up with a primary doctor to ensure resolution of symptoms.

## 2018-08-06 NOTE — ED Provider Notes (Signed)
[facility] EMERGENCY DEPARTMENT Provider Note   CSN: [id number] Arrival date & time: 08/05/18  2016     History   Chief Complaint Chief Complaint  Patient presents with  . Sore Throat  . Diarrhea    HPI Karina [name] is a 37 y.o. female.   37 year old female presents to the emergency department for evaluation of multiple complaints.  Reports that she had a fever of 103.4 F at home on Sunday night.  She awoke on Monday morning with sore throat as well as left-sided otalgia.  This was associated with vomiting and watery, nonbloody diarrhea.  She states that the vomiting has spontaneously improved, but diarrhea has persisted.  She has been able to tolerate oral fluids, but notes that this prompts her to have a bowel movement.  She has not taken any medication for her diarrhea or her sore throat or otalgia.  Denies any persistent or ongoing fever.  Patient states that she may have come in contact with multiple sick contacts, but denies knowledge of any individual with similar symptoms.  Denies any recent antibiotic use or travel outside the country.  No complaints of abdominal pain, inability to swallow or drooling, melena, hematochezia.  Currently on menses.     Past Medical History:  Diagnosis Date  . Abnormal Pap smear   . Chlamydia   . Depression   . Headache(784.0)   . Hypertension   . Infection   . IUFD (intrauterine fetal death) 09/07/2004   Term IUFD at 40 weeks, unknown cause   . Pregnancy induced hypertension   . Preterm labor   . UTI (lower urinary tract infection)   . Viral meningitis     Patient Active Problem List   Diagnosis Date Noted  . Preterm premature rupture of membranes (PPROM) delivered, current hospitalization 10/21/2012  . Premature delivery 10/21/2012    Past Surgical History:  Procedure Laterality Date  . elective abortion       OB History    Gravida  15   Para  7   Term  5   Preterm  2   AB  7   Living  6     SAB  3   TAB  4   Ectopic  0   Multiple  0   Live Births  1            Home Medications    Prior to Admission medications   Medication Sig Start Date End Date Taking? Authorizing Provider  dicyclomine (BENTYL) 20 MG tablet Take 1 tablet (20 mg total) by mouth every 12 (twelve) hours as needed (for abdominal pain/cramping). 08/06/18   Antony Madura, PA-C  ondansetron (ZOFRAN) 4 MG tablet Take 1 tablet (4 mg total) by mouth every 6 (six) hours. Patient not taking: Reported on 07/21/2015 05/13/14   Clement Sayres, MD  penicillin v potassium (VEETID) 500 MG tablet Take 1 tablet (500 mg total) by mouth 2 (two) times daily. X 7 days Patient not taking: Reported on 08/06/2018 07/22/15   Horton, Mayer Masker, MD  promethazine (PHENERGAN) 25 MG tablet Take 1 tablet (25 mg total) by mouth every 6 (six) hours as needed for nausea or vomiting. 08/06/18   Antony Madura, PA-C    Family History Family History  Problem Relation Age of Onset  . Cancer Mother   . Diabetes Mother   . Hyperlipidemia Mother   . Depression Mother   . Hypertension Mother   . Mental illness Mother   .  Arthritis Maternal Grandmother   . Diabetes Maternal Grandmother   . Stroke Maternal Grandmother   . Diabetes Maternal Grandfather   . Hypertension Maternal Grandfather     Social History Social History   Tobacco Use  . Smoking status: Current Some Day Smoker    Packs/day: 5.00    Years: 10.00    Pack years: 50.00    Types: Cigars  . Smokeless tobacco: Never Used  Substance Use Topics  . Alcohol use: No    Comment: Pt smokes "weed" everyday  . Drug use: Yes    Types: Marijuana     Allergies   Shellfish allergy and Latex   Review of Systems Review of Systems Ten systems reviewed and are negative for acute change, except as noted in the HPI.    Physical Exam Updated Vital Signs BP 136/72 (BP Location: Left Arm)   Pulse 66   Temp 98.5 F (36.9 C) (Oral)   Resp 16   Ht 5\' 7"  (1.702 m)   Wt 78.9  kg   LMP 08/05/2018   SpO2 100%   BMI 27.25 kg/m   Physical Exam  Constitutional: She is oriented to person, place, and time. She appears well-developed and well-nourished. No distress.  Nontoxic appearing and in no acute distress.  HENT:  Head: Normocephalic and atraumatic.  Mildly dry mucous membranes  Eyes: Conjunctivae and EOM are normal. No scleral icterus.  Neck: Normal range of motion.  Cardiovascular: Normal rate, regular rhythm and intact distal pulses.  Pulmonary/Chest: Effort normal. No stridor. No respiratory distress. She has no wheezes.  Abdominal:  Mild generalized tenderness.  Abdomen soft, nondistended.  No peritoneal signs or palpable masses.  Musculoskeletal: Normal range of motion.  Neurological: She is alert and oriented to person, place, and time. She exhibits normal muscle tone. Coordination normal.  Skin: Skin is warm and dry. No rash noted. She is not diaphoretic. No erythema. No pallor.  Psychiatric: She has a normal mood and affect. Her behavior is normal.  Nursing note and vitals reviewed.    ED Treatments / Results  Labs (all labs ordered are listed, but only abnormal results are displayed) Labs Reviewed  COMPREHENSIVE METABOLIC PANEL - Abnormal; Notable for the following components:      Result Value   Creatinine, Ser 1.06 (*)    All other components within normal limits  URINALYSIS, ROUTINE W REFLEX MICROSCOPIC - Abnormal; Notable for the following components:   Color, Urine RED (*)    APPearance TURBID (*)    Glucose, UA   (*)    Value: TEST NOT REPORTED DUE TO COLOR INTERFERENCE OF URINE PIGMENT   Hgb urine dipstick   (*)    Value: TEST NOT REPORTED DUE TO COLOR INTERFERENCE OF URINE PIGMENT   Bilirubin Urine   (*)    Value: TEST NOT REPORTED DUE TO COLOR INTERFERENCE OF URINE PIGMENT   Ketones, ur   (*)    Value: TEST NOT REPORTED DUE TO COLOR INTERFERENCE OF URINE PIGMENT   Protein, ur   (*)    Value: TEST NOT REPORTED DUE TO COLOR  INTERFERENCE OF URINE PIGMENT   Nitrite   (*)    Value: TEST NOT REPORTED DUE TO COLOR INTERFERENCE OF URINE PIGMENT   Leukocytes, UA   (*)    Value: TEST NOT REPORTED DUE TO COLOR INTERFERENCE OF URINE PIGMENT   RBC / HPF >50 (*)    Bacteria, UA RARE (*)    All other components within  normal limits  GROUP A STREP BY PCR  C DIFFICILE QUICK SCREEN W PCR REFLEX  LIPASE, BLOOD  CBC  I-STAT BETA HCG BLOOD, ED ([facility], [facility], [facility] ONLY)    EKG None  Radiology No results found.  Procedures Procedures (including critical care time)  Medications Ordered in ED Medications  sodium chloride 0.9 % bolus 2,000 mL (0 mLs Intravenous Stopped 08/06/18 0336)    4:25 AM Unable to provide stool specimen.  No persistent diarrhea noted.  Has been able to tolerate oral fluids.  Hydrated with 2 L of IV fluids as well.   Initial Impression / Assessment and Plan / ED Course  I have reviewed the triage vital signs and the nursing notes.  Pertinent labs & imaging results that were available during my care of the patient were reviewed by me and considered in my medical decision making (see chart for details).     37 year old female presents for multiple complaints including dysphasia, otalgia, vomiting, diarrhea.  Notes proceeding fever of 103.4 F which has spontaneously resolved.  The patient is afebrile in the emergency department today.  Laboratory work-up reassuring without leukocytosis or electrolyte derangements.  Mild elevation to kidney function suspected secondary to dehydration in the setting of ongoing diarrhea.    The patient was put in for a GI pathogen panel and C. difficile screen, but was unable to provide stool specimen for testing.  She was hydrated in the ED with 2 L of IV fluids.  Now tolerating oral fluids without ongoing diarrhea.  She has no focal tenderness on abdominal exam.  Abdomen is soft without palpable masses.  Suspect symptoms to be secondary to viral etiology.  Will continue  with supportive outpatient management.  The patient was prescribed Phenergan as well as Bentyl.  I have encouraged follow-up with her primary care doctor.  Return precautions discussed and provided. Patient discharged in stable condition with no unaddressed concerns.   Final Clinical Impressions(s) / ED Diagnoses   Final diagnoses:  Diarrhea, unspecified type  Viral illness    ED Discharge Orders         Ordered    promethazine (PHENERGAN) 25 MG tablet  Every 6 hours PRN     08/06/18 0421    dicyclomine (BENTYL) 20 MG tablet  Every 12 hours PRN     08/06/18 0421           Antony Madura, PA-C 08/06/18 0503    Glynn Octave, MD 08/06/18 [phone]

## 2018-08-06 NOTE — ED Notes (Signed)
Fluid Challenge: pt. given H2O to drink .

## 2021-10-02 DIAGNOSIS — M792 Neuralgia and neuritis, unspecified: Secondary | ICD-10-CM | POA: Diagnosis not present

## 2021-10-02 DIAGNOSIS — M545 Low back pain, unspecified: Secondary | ICD-10-CM | POA: Diagnosis not present

## 2021-10-02 DIAGNOSIS — R7303 Prediabetes: Secondary | ICD-10-CM | POA: Diagnosis not present

## 2021-10-02 DIAGNOSIS — R7989 Other specified abnormal findings of blood chemistry: Secondary | ICD-10-CM | POA: Diagnosis not present

## 2021-10-02 DIAGNOSIS — E782 Mixed hyperlipidemia: Secondary | ICD-10-CM | POA: Diagnosis not present

## 2021-10-02 DIAGNOSIS — Z23 Encounter for immunization: Secondary | ICD-10-CM | POA: Diagnosis not present

## 2021-10-02 DIAGNOSIS — R3121 Asymptomatic microscopic hematuria: Secondary | ICD-10-CM | POA: Diagnosis not present

## 2021-12-26 DIAGNOSIS — N946 Dysmenorrhea, unspecified: Secondary | ICD-10-CM | POA: Diagnosis not present

## 2021-12-26 DIAGNOSIS — N921 Excessive and frequent menstruation with irregular cycle: Secondary | ICD-10-CM | POA: Diagnosis not present

## 2021-12-26 DIAGNOSIS — I10 Essential (primary) hypertension: Secondary | ICD-10-CM | POA: Diagnosis not present

## 2021-12-26 DIAGNOSIS — R102 Pelvic and perineal pain: Secondary | ICD-10-CM | POA: Diagnosis not present

## 2021-12-28 DIAGNOSIS — M792 Neuralgia and neuritis, unspecified: Secondary | ICD-10-CM | POA: Diagnosis not present

## 2021-12-28 DIAGNOSIS — E782 Mixed hyperlipidemia: Secondary | ICD-10-CM | POA: Diagnosis not present

## 2021-12-28 DIAGNOSIS — R3121 Asymptomatic microscopic hematuria: Secondary | ICD-10-CM | POA: Diagnosis not present

## 2021-12-28 DIAGNOSIS — M545 Low back pain, unspecified: Secondary | ICD-10-CM | POA: Diagnosis not present

## 2021-12-28 DIAGNOSIS — R7303 Prediabetes: Secondary | ICD-10-CM | POA: Diagnosis not present

## 2021-12-28 DIAGNOSIS — R7989 Other specified abnormal findings of blood chemistry: Secondary | ICD-10-CM | POA: Diagnosis not present

## 2022-01-25 DIAGNOSIS — R7989 Other specified abnormal findings of blood chemistry: Secondary | ICD-10-CM | POA: Diagnosis not present

## 2022-03-02 DIAGNOSIS — E669 Obesity, unspecified: Secondary | ICD-10-CM | POA: Diagnosis not present

## 2022-03-02 DIAGNOSIS — N390 Urinary tract infection, site not specified: Secondary | ICD-10-CM | POA: Diagnosis not present

## 2022-03-02 DIAGNOSIS — N182 Chronic kidney disease, stage 2 (mild): Secondary | ICD-10-CM | POA: Diagnosis not present

## 2022-05-17 DIAGNOSIS — R7989 Other specified abnormal findings of blood chemistry: Secondary | ICD-10-CM | POA: Diagnosis not present

## 2022-05-17 DIAGNOSIS — E782 Mixed hyperlipidemia: Secondary | ICD-10-CM | POA: Diagnosis not present

## 2022-05-17 DIAGNOSIS — I10 Essential (primary) hypertension: Secondary | ICD-10-CM | POA: Diagnosis not present

## 2022-05-17 DIAGNOSIS — M792 Neuralgia and neuritis, unspecified: Secondary | ICD-10-CM | POA: Diagnosis not present

## 2022-05-17 DIAGNOSIS — R7303 Prediabetes: Secondary | ICD-10-CM | POA: Diagnosis not present

## 2022-05-31 DIAGNOSIS — N946 Dysmenorrhea, unspecified: Secondary | ICD-10-CM | POA: Diagnosis not present

## 2022-05-31 DIAGNOSIS — Z0001 Encounter for general adult medical examination with abnormal findings: Secondary | ICD-10-CM | POA: Diagnosis not present

## 2022-05-31 DIAGNOSIS — Z131 Encounter for screening for diabetes mellitus: Secondary | ICD-10-CM | POA: Diagnosis not present

## 2022-05-31 DIAGNOSIS — R7989 Other specified abnormal findings of blood chemistry: Secondary | ICD-10-CM | POA: Diagnosis not present

## 2022-05-31 DIAGNOSIS — Z1329 Encounter for screening for other suspected endocrine disorder: Secondary | ICD-10-CM | POA: Diagnosis not present

## 2022-05-31 DIAGNOSIS — Z136 Encounter for screening for cardiovascular disorders: Secondary | ICD-10-CM | POA: Diagnosis not present

## 2022-07-30 DIAGNOSIS — Z30013 Encounter for initial prescription of injectable contraceptive: Secondary | ICD-10-CM | POA: Diagnosis not present

## 2022-07-30 DIAGNOSIS — I10 Essential (primary) hypertension: Secondary | ICD-10-CM | POA: Diagnosis not present

## 2022-07-30 DIAGNOSIS — Z3202 Encounter for pregnancy test, result negative: Secondary | ICD-10-CM | POA: Diagnosis not present

## 2022-08-08 DIAGNOSIS — R7303 Prediabetes: Secondary | ICD-10-CM | POA: Diagnosis not present

## 2022-08-08 DIAGNOSIS — I10 Essential (primary) hypertension: Secondary | ICD-10-CM | POA: Diagnosis not present

## 2022-08-08 DIAGNOSIS — R7989 Other specified abnormal findings of blood chemistry: Secondary | ICD-10-CM | POA: Diagnosis not present

## 2022-08-08 DIAGNOSIS — E782 Mixed hyperlipidemia: Secondary | ICD-10-CM | POA: Diagnosis not present

## 2022-08-08 DIAGNOSIS — M792 Neuralgia and neuritis, unspecified: Secondary | ICD-10-CM | POA: Diagnosis not present

## 2022-09-18 DIAGNOSIS — B3731 Acute candidiasis of vulva and vagina: Secondary | ICD-10-CM | POA: Diagnosis not present

## 2022-09-18 DIAGNOSIS — Z113 Encounter for screening for infections with a predominantly sexual mode of transmission: Secondary | ICD-10-CM | POA: Diagnosis not present

## 2022-09-18 DIAGNOSIS — L0232 Furuncle of buttock: Secondary | ICD-10-CM | POA: Diagnosis not present

## 2022-09-18 DIAGNOSIS — Z114 Encounter for screening for human immunodeficiency virus [HIV]: Secondary | ICD-10-CM | POA: Diagnosis not present

## 2022-12-14 DIAGNOSIS — Z3042 Encounter for surveillance of injectable contraceptive: Secondary | ICD-10-CM | POA: Diagnosis not present

## 2022-12-14 DIAGNOSIS — Z3202 Encounter for pregnancy test, result negative: Secondary | ICD-10-CM | POA: Diagnosis not present

## 2023-01-16 DIAGNOSIS — S39012A Strain of muscle, fascia and tendon of lower back, initial encounter: Secondary | ICD-10-CM | POA: Diagnosis not present

## 2023-01-16 DIAGNOSIS — M25512 Pain in left shoulder: Secondary | ICD-10-CM | POA: Diagnosis not present

## 2023-01-16 DIAGNOSIS — M792 Neuralgia and neuritis, unspecified: Secondary | ICD-10-CM | POA: Diagnosis not present

## 2023-01-16 DIAGNOSIS — R7989 Other specified abnormal findings of blood chemistry: Secondary | ICD-10-CM | POA: Diagnosis not present

## 2023-01-16 DIAGNOSIS — E782 Mixed hyperlipidemia: Secondary | ICD-10-CM | POA: Diagnosis not present

## 2023-01-16 DIAGNOSIS — I10 Essential (primary) hypertension: Secondary | ICD-10-CM | POA: Diagnosis not present

## 2023-01-16 DIAGNOSIS — R7303 Prediabetes: Secondary | ICD-10-CM | POA: Diagnosis not present

## 2023-01-30 DIAGNOSIS — R7303 Prediabetes: Secondary | ICD-10-CM | POA: Diagnosis not present

## 2023-01-30 DIAGNOSIS — I10 Essential (primary) hypertension: Secondary | ICD-10-CM | POA: Diagnosis not present

## 2023-01-30 DIAGNOSIS — M792 Neuralgia and neuritis, unspecified: Secondary | ICD-10-CM | POA: Diagnosis not present

## 2023-01-30 DIAGNOSIS — E782 Mixed hyperlipidemia: Secondary | ICD-10-CM | POA: Diagnosis not present

## 2023-01-30 DIAGNOSIS — R7989 Other specified abnormal findings of blood chemistry: Secondary | ICD-10-CM | POA: Diagnosis not present

## 2023-03-05 DIAGNOSIS — Z3042 Encounter for surveillance of injectable contraceptive: Secondary | ICD-10-CM | POA: Diagnosis not present

## 2023-03-15 DIAGNOSIS — T385X5A Adverse effect of other estrogens and progestogens, initial encounter: Secondary | ICD-10-CM | POA: Diagnosis not present

## 2023-03-15 DIAGNOSIS — N938 Other specified abnormal uterine and vaginal bleeding: Secondary | ICD-10-CM | POA: Diagnosis not present

## 2023-03-15 DIAGNOSIS — Z3042 Encounter for surveillance of injectable contraceptive: Secondary | ICD-10-CM | POA: Diagnosis not present

## 2023-03-15 DIAGNOSIS — I10 Essential (primary) hypertension: Secondary | ICD-10-CM | POA: Diagnosis not present

## 2023-04-11 DIAGNOSIS — N921 Excessive and frequent menstruation with irregular cycle: Secondary | ICD-10-CM | POA: Diagnosis not present

## 2023-04-11 DIAGNOSIS — E782 Mixed hyperlipidemia: Secondary | ICD-10-CM | POA: Diagnosis not present

## 2023-04-11 DIAGNOSIS — M792 Neuralgia and neuritis, unspecified: Secondary | ICD-10-CM | POA: Diagnosis not present

## 2023-04-11 DIAGNOSIS — N946 Dysmenorrhea, unspecified: Secondary | ICD-10-CM | POA: Diagnosis not present

## 2023-04-11 DIAGNOSIS — R7989 Other specified abnormal findings of blood chemistry: Secondary | ICD-10-CM | POA: Diagnosis not present

## 2023-04-11 DIAGNOSIS — R7303 Prediabetes: Secondary | ICD-10-CM | POA: Diagnosis not present

## 2023-04-11 DIAGNOSIS — I10 Essential (primary) hypertension: Secondary | ICD-10-CM | POA: Diagnosis not present

## 2023-04-11 DIAGNOSIS — N926 Irregular menstruation, unspecified: Secondary | ICD-10-CM | POA: Diagnosis not present

## 2023-04-18 ENCOUNTER — Other Ambulatory Visit: Payer: Self-pay | Admitting: Physician Assistant

## 2023-04-18 DIAGNOSIS — Z1231 Encounter for screening mammogram for malignant neoplasm of breast: Secondary | ICD-10-CM

## 2023-05-15 ENCOUNTER — Ambulatory Visit
Admission: RE | Admit: 2023-05-15 | Discharge: 2023-05-15 | Disposition: A | Discharge status: Home / Self Care | Payer: Medicaid Other | Source: Ambulatory Visit | Attending: Physician Assistant | Admitting: Physician Assistant

## 2023-05-15 DIAGNOSIS — Z1231 Encounter for screening mammogram for malignant neoplasm of breast: Secondary | ICD-10-CM

## 2023-05-21 ENCOUNTER — Other Ambulatory Visit: Payer: Self-pay | Admitting: Physician Assistant

## 2023-05-21 DIAGNOSIS — R928 Other abnormal and inconclusive findings on diagnostic imaging of breast: Secondary | ICD-10-CM

## 2023-05-23 ENCOUNTER — Ambulatory Visit
Admission: RE | Admit: 2023-05-23 | Discharge: 2023-05-23 | Disposition: A | Discharge status: Home / Self Care | Payer: Medicaid Other | Source: Ambulatory Visit | Attending: Physician Assistant | Admitting: Physician Assistant

## 2023-05-23 ENCOUNTER — Other Ambulatory Visit: Payer: Self-pay | Admitting: Physician Assistant

## 2023-05-23 ENCOUNTER — Ambulatory Visit: Payer: Medicaid Other

## 2023-05-23 DIAGNOSIS — R928 Other abnormal and inconclusive findings on diagnostic imaging of breast: Secondary | ICD-10-CM

## 2023-05-23 DIAGNOSIS — N63 Unspecified lump in unspecified breast: Secondary | ICD-10-CM

## 2023-06-06 ENCOUNTER — Other Ambulatory Visit: Payer: Medicaid Other

## 2023-08-07 DIAGNOSIS — Z131 Encounter for screening for diabetes mellitus: Secondary | ICD-10-CM | POA: Diagnosis not present

## 2023-08-07 DIAGNOSIS — G44209 Tension-type headache, unspecified, not intractable: Secondary | ICD-10-CM | POA: Diagnosis not present

## 2023-08-07 DIAGNOSIS — E782 Mixed hyperlipidemia: Secondary | ICD-10-CM | POA: Diagnosis not present

## 2023-08-07 DIAGNOSIS — Z0001 Encounter for general adult medical examination with abnormal findings: Secondary | ICD-10-CM | POA: Diagnosis not present

## 2023-08-07 DIAGNOSIS — M792 Neuralgia and neuritis, unspecified: Secondary | ICD-10-CM | POA: Diagnosis not present

## 2023-08-07 DIAGNOSIS — N921 Excessive and frequent menstruation with irregular cycle: Secondary | ICD-10-CM | POA: Diagnosis not present

## 2023-08-07 DIAGNOSIS — R7303 Prediabetes: Secondary | ICD-10-CM | POA: Diagnosis not present

## 2023-08-07 DIAGNOSIS — N946 Dysmenorrhea, unspecified: Secondary | ICD-10-CM | POA: Diagnosis not present

## 2023-08-07 DIAGNOSIS — I10 Essential (primary) hypertension: Secondary | ICD-10-CM | POA: Diagnosis not present

## 2023-08-18 ENCOUNTER — Emergency Department (HOSPITAL_COMMUNITY): Payer: Medicaid Other

## 2023-08-18 ENCOUNTER — Encounter (HOSPITAL_COMMUNITY): Payer: Self-pay | Admitting: *Deleted

## 2023-08-18 ENCOUNTER — Other Ambulatory Visit: Payer: Self-pay

## 2023-08-18 ENCOUNTER — Emergency Department (HOSPITAL_COMMUNITY)
Admission: EM | Admit: 2023-08-18 | Discharge: 2023-08-19 | Disposition: A | Discharge status: Home / Self Care | Payer: Medicaid Other | Attending: Emergency Medicine | Admitting: Emergency Medicine

## 2023-08-18 DIAGNOSIS — X509XXA Other and unspecified overexertion or strenuous movements or postures, initial encounter: Secondary | ICD-10-CM | POA: Diagnosis not present

## 2023-08-18 DIAGNOSIS — Z91040 Latex allergy status: Secondary | ICD-10-CM | POA: Insufficient documentation

## 2023-08-18 DIAGNOSIS — W19XXXA Unspecified fall, initial encounter: Secondary | ICD-10-CM | POA: Diagnosis not present

## 2023-08-18 DIAGNOSIS — R609 Edema, unspecified: Secondary | ICD-10-CM | POA: Diagnosis not present

## 2023-08-18 DIAGNOSIS — M25562 Pain in left knee: Secondary | ICD-10-CM | POA: Diagnosis not present

## 2023-08-18 DIAGNOSIS — I10 Essential (primary) hypertension: Secondary | ICD-10-CM | POA: Insufficient documentation

## 2023-08-18 DIAGNOSIS — S82202A Unspecified fracture of shaft of left tibia, initial encounter for closed fracture: Secondary | ICD-10-CM | POA: Diagnosis not present

## 2023-08-18 DIAGNOSIS — M7989 Other specified soft tissue disorders: Secondary | ICD-10-CM | POA: Diagnosis not present

## 2023-08-18 DIAGNOSIS — S82832A Other fracture of upper and lower end of left fibula, initial encounter for closed fracture: Secondary | ICD-10-CM | POA: Diagnosis not present

## 2023-08-18 DIAGNOSIS — S82422A Displaced transverse fracture of shaft of left fibula, initial encounter for closed fracture: Secondary | ICD-10-CM | POA: Diagnosis not present

## 2023-08-18 NOTE — ED Triage Notes (Signed)
Pt arrives via [facility], per report, pt was in the kitchen, turned and says her knees buckled, she c/o left knee and left ankle pain. Swelling and tenderness. Denies blood thinner, no LOC, did not hit her head. En route 150/90, hr 82, 99% ra,

## 2023-08-18 NOTE — ED Triage Notes (Signed)
Pt says that her left knee, buckled, hit the right knee and she fell to the floor. She says when she sat back up it "popped the knee back in place" She says that she is having left knee pain and left ankle pain.

## 2023-08-19 MED ORDER — HYDROCODONE-ACETAMINOPHEN 5-325 MG PO TABS
1.0000 | ORAL_TABLET | Freq: Four times a day (QID) | ORAL | 0 refills | Status: DC | PRN
Start: 2023-08-19 — End: 2024-05-04

## 2023-08-19 MED ORDER — HYDROCODONE-ACETAMINOPHEN 5-325 MG PO TABS
2.0000 | ORAL_TABLET | Freq: Once | ORAL | Status: AC
  Administered 2023-08-19: 2 via ORAL
  Filled 2023-08-19: qty 2

## 2023-08-19 NOTE — ED Provider Notes (Signed)
[place] EMERGENCY DEPARTMENT AT [facility] Provider Note   CSN: [id number] Arrival date & time: 08/18/23  2253     History  Chief Complaint  Patient presents with   Marletta Lor    Karina [name] is a 42 y.o. female.  Patient presents the emergency room complaining of left knee and ankle pain secondary to a fall.  Patient states she was serving dinner to family when she felt that her left knee may have buckled, hitting the right knee, and subsequently causing her to fall.  She states that when she sat up and felt that her knee popped back into place.  She denies any right knee pain at this time.  She states it hurts to bear weight on the left lower extremity.  She denies any her head and denies loss of consciousness.  Past history significant for hypertension.   Fall       Home Medications Prior to Admission medications   Medication Sig Start Date End Date Taking? Authorizing Provider  HYDROcodone-acetaminophen (NORCO/VICODIN) 5-325 MG tablet Take 1 tablet by mouth every 6 (six) hours as needed. 08/19/23  Yes Barrie Dunker B, PA-C  NIFEdipine (PROCARDIA-XL/NIFEDICAL-XL) 30 MG 24 hr tablet Take 30 mg by mouth daily. 09/03/22  Yes [provider]  dicyclomine (BENTYL) 20 MG tablet Take 1 tablet (20 mg total) by mouth every 12 (twelve) hours as needed (for abdominal pain/cramping). 08/06/18   Antony Madura, PA-C  ondansetron (ZOFRAN) 4 MG tablet Take 1 tablet (4 mg total) by mouth every 6 (six) hours. Patient not taking: Reported on 07/21/2015 05/13/14   Clement Sayres, MD  penicillin v potassium (VEETID) 500 MG tablet Take 1 tablet (500 mg total) by mouth 2 (two) times daily. X 7 days Patient not taking: Reported on 08/06/2018 07/22/15   Horton, Mayer Masker, MD  promethazine (PHENERGAN) 25 MG tablet Take 1 tablet (25 mg total) by mouth every 6 (six) hours as needed for nausea or vomiting. 08/06/18   Antony Madura, PA-C  tiZANidine (ZANAFLEX) 2 MG tablet Take 2-4 mg by mouth  every 8 (eight) hours as needed.    [provider]      Allergies    Shellfish allergy, Aspirin, and Latex    Review of Systems   Review of Systems  Physical Exam Updated Vital Signs BP (!) 149/102   Pulse 65   Temp 98.6 F (37 C) (Oral)   Resp 18   Ht 5\' 6"  (1.676 m)   Wt 101.2 kg   SpO2 98%   BMI 35.99 kg/m  Physical Exam Vitals and nursing note reviewed.  HENT:     Head: Normocephalic and atraumatic.  Eyes:     Pupils: Pupils are equal, round, and reactive to light.  Pulmonary:     Effort: Pulmonary effort is normal. No respiratory distress.  Musculoskeletal:        General: Tenderness and signs of injury present. No swelling or deformity.     Cervical back: Normal range of motion.     Comments: Patient with normal range of motion of the left knee.  Tenderness to generalized palpation of the left knee along with palpation of the lateral malleolus.  No significant swelling appreciated.  Skin is intact.  Palpable pedal pulse.  Skin:    General: Skin is dry.  Neurological:     Mental Status: She is alert.  Psychiatric:        Speech: Speech normal.        Behavior:  Behavior normal.     ED Results / Procedures / Treatments   Labs (all labs ordered are listed, but only abnormal results are displayed) Labs Reviewed - No data to display  EKG None  Radiology DG Knee Complete 4 Views Left  Result Date: 08/19/2023 CLINICAL DATA:  Left knee pain and swelling EXAM: LEFT KNEE - COMPLETE 4+ VIEW COMPARISON:  None Available. FINDINGS: No evidence of fracture, dislocation, or joint effusion. No evidence of arthropathy or other focal bone abnormality. Soft tissues are unremarkable. IMPRESSION: Negative. Electronically Signed   By: Helyn Numbers M.D.   On: 08/19/2023 01:01   DG Ankle Complete Left  Result Date: 08/19/2023 CLINICAL DATA:  Left ankle pain and swelling EXAM: LEFT ANKLE COMPLETE - 3+ VIEW COMPARISON:  None Available. FINDINGS: There is an acute  transverse fracture of the distal fibular metaphyseal region at the level of the tibial plafond with fracture fragments in near anatomic alignment. The ankle mortise is aligned. No dislocation. No ankle effusion. Mild soft tissue swelling of the anterolateral soft tissues. IMPRESSION: 1. Acute transverse fracture of the distal fibular metaphyseal region. Electronically Signed   By: Helyn Numbers M.D.   On: 08/19/2023 01:01    Procedures .Ortho Injury Treatment  Date/Time: 08/19/2023 3:19 AM  Performed by: Darrick Grinder, PA-C Authorized by: Darrick Grinder, PA-C   Consent:    Consent obtained:  Verbal   Consent given by:  Patient   Risks discussed:  Stiffness, vascular damage, restricted joint movement and nerve damage   Alternatives discussed:  Alternative treatmentInjury location: ankle Location details: left ankle Injury type: fracture Fracture type: lateral malleolus Pre-procedure neurovascular assessment: neurovascularly intact Manipulation performed: no Immobilization: Cam walker boot. Splint Applied by: ED Nurse Post-procedure neurovascular assessment: post-procedure neurovascularly intact       Medications Ordered in ED Medications  HYDROcodone-acetaminophen (NORCO/VICODIN) 5-325 MG per tablet 2 tablet (2 tablets Oral Given 08/19/23 0301)    ED Course/ Medical Decision Making/ A&P                                 Medical Decision Making Amount and/or Complexity of Data Reviewed Radiology: ordered.  Risk Prescription drug management.   This patient presents to the ED for concern of left knee and ankle pain, this involves an extensive number of treatment options, and is a complaint that carries with it a high risk of complications and morbidity.  The differential diagnosis includes fracture, dislocation, soft tissue injury, others   Co morbidities that complicate the patient evaluation  Hypertension   Additional history obtained:  Additional history  obtained from visitor at bedside   Imaging Studies ordered:  I ordered imaging studies including plain films of the left knee and left ankle. I independently visualized and interpreted imaging which showed no acute fracture or dislocation in the left knee.  Acute transverse fracture of the distal fibula at the metaphysis I agree with the radiologist interpretation   Problem List / ED Course / Critical interventions / Medication management   I ordered medication including hydrocodone for pain Reevaluation of the patient after these medicines showed that the patient improved I have reviewed the patients home medicines and have made adjustments as needed   Social Determinants of Health:  Patient has Medicaid for her primary health insurance type   Test / Admission - Considered:  Patient with distal fibular fracture.  Patient provided with cam walker boot.  Patient denied crutches as she states that her husband has some at home that they can adjust to fit her.  Plan to discharge home with short course of hydrocodone for pain control, recommendations for acetaminophen and ibuprofen, and information on follow-up with orthopedics.  Patient understands that she should ambulate with a cam walker boot and crutches until following up with orthopedics.  Discharged home with return precautions.         Final Clinical Impression(s) / ED Diagnoses Final diagnoses:  Fall, initial encounter  Closed fracture of distal end of left fibula, unspecified fracture morphology, initial encounter  Acute pain of left knee    Rx / DC Orders ED Discharge Orders          Ordered    HYDROcodone-acetaminophen (NORCO/VICODIN) 5-325 MG tablet  Every 6 hours PRN        08/19/23 0325              Darrick Grinder, PA-C 08/19/23 0326    Nira Conn, MD 08/20/23 [phone]

## 2023-08-19 NOTE — Discharge Instructions (Signed)
You were diagnosed with a fibula fracture tonight on imaging.  Please use the cam walker boot along with crutches when ambulating.  Please schedule a follow-up appointment with orthopedics.  I have provided a short course of hydrocodone for any breakthrough pain.  You should use ibuprofen and acetaminophen for routine pain control.  Do not exceed 4000 mg of acetaminophen from all sources in a 24-hour period.

## 2023-08-20 DIAGNOSIS — S82832A Other fracture of upper and lower end of left fibula, initial encounter for closed fracture: Secondary | ICD-10-CM | POA: Diagnosis not present

## 2023-08-22 DIAGNOSIS — M25562 Pain in left knee: Secondary | ICD-10-CM | POA: Diagnosis not present

## 2023-08-23 DIAGNOSIS — M25562 Pain in left knee: Secondary | ICD-10-CM | POA: Diagnosis not present

## 2023-08-23 DIAGNOSIS — S82832A Other fracture of upper and lower end of left fibula, initial encounter for closed fracture: Secondary | ICD-10-CM | POA: Diagnosis not present

## 2023-08-27 DIAGNOSIS — M25562 Pain in left knee: Secondary | ICD-10-CM | POA: Diagnosis not present

## 2023-08-27 DIAGNOSIS — S82832A Other fracture of upper and lower end of left fibula, initial encounter for closed fracture: Secondary | ICD-10-CM | POA: Diagnosis not present

## 2023-09-10 DIAGNOSIS — S82832A Other fracture of upper and lower end of left fibula, initial encounter for closed fracture: Secondary | ICD-10-CM | POA: Diagnosis not present

## 2023-10-01 DIAGNOSIS — S82832D Other fracture of upper and lower end of left fibula, subsequent encounter for closed fracture with routine healing: Secondary | ICD-10-CM | POA: Diagnosis not present

## 2023-11-25 ENCOUNTER — Other Ambulatory Visit: Payer: Medicaid Other

## 2023-12-05 ENCOUNTER — Other Ambulatory Visit: Payer: Medicaid Other

## 2023-12-25 ENCOUNTER — Inpatient Hospital Stay: Admission: RE | Admit: 2023-12-25 | Payer: Medicaid Other | Source: Ambulatory Visit

## 2024-01-29 DIAGNOSIS — N921 Excessive and frequent menstruation with irregular cycle: Secondary | ICD-10-CM | POA: Diagnosis not present

## 2024-01-29 DIAGNOSIS — L600 Ingrowing nail: Secondary | ICD-10-CM | POA: Diagnosis not present

## 2024-01-29 DIAGNOSIS — T7840XA Allergy, unspecified, initial encounter: Secondary | ICD-10-CM | POA: Diagnosis not present

## 2024-01-29 DIAGNOSIS — N946 Dysmenorrhea, unspecified: Secondary | ICD-10-CM | POA: Diagnosis not present

## 2024-01-29 DIAGNOSIS — R7303 Prediabetes: Secondary | ICD-10-CM | POA: Diagnosis not present

## 2024-01-29 DIAGNOSIS — I10 Essential (primary) hypertension: Secondary | ICD-10-CM | POA: Diagnosis not present

## 2024-01-29 DIAGNOSIS — E782 Mixed hyperlipidemia: Secondary | ICD-10-CM | POA: Diagnosis not present

## 2024-02-06 DIAGNOSIS — M79672 Pain in left foot: Secondary | ICD-10-CM | POA: Diagnosis not present

## 2024-02-06 DIAGNOSIS — L600 Ingrowing nail: Secondary | ICD-10-CM | POA: Diagnosis not present

## 2024-02-19 DIAGNOSIS — N946 Dysmenorrhea, unspecified: Secondary | ICD-10-CM | POA: Diagnosis not present

## 2024-02-19 DIAGNOSIS — E782 Mixed hyperlipidemia: Secondary | ICD-10-CM | POA: Diagnosis not present

## 2024-02-19 DIAGNOSIS — I10 Essential (primary) hypertension: Secondary | ICD-10-CM | POA: Diagnosis not present

## 2024-02-19 DIAGNOSIS — R7303 Prediabetes: Secondary | ICD-10-CM | POA: Diagnosis not present

## 2024-02-19 DIAGNOSIS — N921 Excessive and frequent menstruation with irregular cycle: Secondary | ICD-10-CM | POA: Diagnosis not present

## 2024-05-03 NOTE — Progress Notes (Unsigned)
 New Patient Note  RE: Karina [name] MRN: [id number] DOB: 1981/08/24 Date of Office Visit: 05/04/2024  Consult requested by: Catalina Bare, MD Primary care provider: Patient, No Pcp Per  Chief Complaint: No chief complaint on file.  History of Present Illness: I had the pleasure of seeing Karina [name] for initial evaluation at the [facility] on 05/04/2024. She is a 43 y.o. female, who is referred here by Patient, No Pcp Per for the evaluation of ***.  Discussed the use of AI scribe software for clinical note transcription with the patient, who gave verbal consent to proceed.  History of Present Illness             ***  Assessment and Plan: Jessicaann is a 43 y.o. female with: ***  Assessment and Plan               No follow-ups on file.  No orders of the defined types were placed in this encounter.  Lab Orders  No laboratory test(s) ordered today    Other allergy screening: Asthma: {Blank single:19197::yes,no} Rhino conjunctivitis: {Blank single:19197::yes,no} Food allergy: {Blank single:19197::yes,no} Medication allergy: {Blank single:19197::yes,no} Hymenoptera allergy: {Blank single:19197::yes,no} Urticaria: {Blank single:19197::yes,no} Eczema:{Blank single:19197::yes,no} History of recurrent infections suggestive of immunodeficency: {Blank single:19197::yes,no}  Diagnostics: Spirometry:  Tracings reviewed. Her effort: {Blank single:19197::Good reproducible efforts.,It was hard to get consistent efforts and there is a question as to whether this reflects a maximal maneuver.,Poor effort, data can not be interpreted.} FVC: ***L FEV1: ***L, ***% predicted FEV1/FVC ratio: ***% Interpretation: {Blank single:19197::Spirometry consistent with mild obstructive disease,Spirometry consistent with moderate obstructive disease,Spirometry consistent with severe obstructive disease,Spirometry  consistent with possible restrictive disease,Spirometry consistent with mixed obstructive and restrictive disease,Spirometry uninterpretable due to technique,Spirometry consistent with normal pattern,No overt abnormalities noted given today's efforts}.  Please see scanned spirometry results for details.  Skin Testing: {Blank single:19197::Select foods,Environmental allergy panel,Environmental allergy panel and select foods,Food allergy panel,None,Deferred due to recent antihistamines use}. *** Results discussed with patient/family.   Past Medical History: Patient Active Problem List   Diagnosis Date Noted  . Preterm premature rupture of membranes (PPROM) delivered, current hospitalization 10/21/2012  . Premature delivery 10/21/2012   Past Medical History:  Diagnosis Date  . Abnormal Pap smear   . Chlamydia   . Depression   . Headache(784.0)   . Hypertension   . Infection   . IUFD (intrauterine fetal death) 16-Sep-2004   Term IUFD at 40 weeks, unknown cause   . Pregnancy induced hypertension   . Preterm labor   . UTI (lower urinary tract infection)   . Viral meningitis    Past Surgical History: Past Surgical History:  Procedure Laterality Date  . elective abortion     Medication List:  Current Outpatient Medications  Medication Sig Dispense Refill  . dicyclomine  (BENTYL ) 20 MG tablet Take 1 tablet (20 mg total) by mouth every 12 (twelve) hours as needed (for abdominal pain/cramping). 20 tablet 0  . HYDROcodone -acetaminophen  (NORCO/VICODIN) 5-325 MG tablet Take 1 tablet by mouth every 6 (six) hours as needed. 10 tablet 0  . NIFEdipine (PROCARDIA-XL/NIFEDICAL-XL) 30 MG 24 hr tablet Take 30 mg by mouth daily.    . ondansetron  (ZOFRAN ) 4 MG tablet Take 1 tablet (4 mg total) by mouth every 6 (six) hours. (Patient not taking: Reported on 07/21/2015) 15 tablet 0  . penicillin  v potassium (VEETID) 500 MG tablet Take 1 tablet (500 mg total) by mouth 2 (two) times  daily. X 7 days (Patient not taking:  Reported on 08/06/2018) 20 tablet 0  . promethazine  (PHENERGAN ) 25 MG tablet Take 1 tablet (25 mg total) by mouth every 6 (six) hours as needed for nausea or vomiting. 12 tablet 0  . tiZANidine (ZANAFLEX) 2 MG tablet Take 2-4 mg by mouth every 8 (eight) hours as needed.     No current facility-administered medications for this visit.   Allergies: Allergies  Allergen Reactions  . Shellfish Allergy Anaphylaxis  . Aspirin Other (See Comments)  . Latex Itching and Rash   Social History: Social History   Socioeconomic History  . Marital status: Single    Spouse name: Not on file  . Number of children: Not on file  . Years of education: Not on file  . Highest education level: Not on file  Occupational History  . Not on file  Tobacco Use  . Smoking status: Some Days    Current packs/day: 5.00    Average packs/day: 5.0 packs/day for 10.0 years (50.0 ttl pk-yrs)    Types: Cigars, Cigarettes  . Smokeless tobacco: Never  Substance and Sexual Activity  . Alcohol use: No    Comment: Pt smokes weed everyday  . Drug use: Yes    Types: Marijuana  . Sexual activity: Yes    Birth control/protection: None  Other Topics Concern  . Not on file  Social History Narrative  . Not on file   Social Drivers of Health   Financial Resource Strain: Not on file  Food Insecurity: Not on file  Transportation Needs: Not on file  Physical Activity: Not on file  Stress: Not on file  Social Connections: Not on file   Lives in a ***. Smoking: *** Occupation: ***  Environmental HistorySurveyor, minerals in the house: Network engineer in the family room: {Blank single:19197::yes,no} Carpet in the bedroom: {Blank single:19197::yes,no} Heating: {Blank single:19197::electric,gas,heat pump} Cooling: {Blank single:19197::central,window,heat pump} Pet: {Blank single:19197::yes ***,no}  Family History: Family  History  Problem Relation Age of Onset  . Cancer Mother   . Diabetes Mother   . Hyperlipidemia Mother   . Depression Mother   . Hypertension Mother   . Mental illness Mother   . Arthritis Maternal Grandmother   . Diabetes Maternal Grandmother   . Stroke Maternal Grandmother   . Diabetes Maternal Grandfather   . Hypertension Maternal Grandfather    Problem                               Relation Asthma                                   *** Eczema                                *** Food allergy                          *** Allergic rhino conjunctivitis     ***  Review of Systems  Constitutional:  Negative for appetite change, chills, fever and unexpected weight change.  HENT:  Negative for congestion and rhinorrhea.   Eyes:  Negative for itching.  Respiratory:  Negative for cough, chest tightness, shortness of breath and wheezing.   Cardiovascular:  Negative for chest pain.  Gastrointestinal:  Negative for abdominal pain.  Genitourinary:  Negative for difficulty urinating.  Skin:  Negative for rash.  Neurological:  Negative for headaches.   Objective: There were no vitals taken for this visit. There is no height or weight on file to calculate BMI. Physical Exam Vitals and nursing note reviewed.  Constitutional:      Appearance: Normal appearance. She is well-developed.  HENT:     Head: Normocephalic and atraumatic.     Right Ear: Tympanic membrane and external ear normal.     Left Ear: Tympanic membrane and external ear normal.     Nose: Nose normal.     Mouth/Throat:     Mouth: Mucous membranes are moist.     Pharynx: Oropharynx is clear.   Eyes:     Conjunctiva/sclera: Conjunctivae normal.    Cardiovascular:     Rate and Rhythm: Normal rate and regular rhythm.     Heart sounds: Normal heart sounds. No murmur heard.    No friction rub. No gallop.  Pulmonary:     Effort: Pulmonary effort is normal.     Breath sounds: Normal breath sounds. No wheezing, rhonchi or  rales.   Musculoskeletal:     Cervical back: Neck supple.   Skin:    General: Skin is warm.     Findings: No rash.   Neurological:     Mental Status: She is alert and oriented to person, place, and time.   Psychiatric:        Behavior: Behavior normal.  The plan was reviewed with the patient/family, and all questions/concerned were addressed.  It was my pleasure to see Karina [name] today and participate in her care. Please feel free to contact me with any questions or concerns.  Sincerely,  Orlan Cramp, DO Allergy & Immunology  [facility]  [facility] office: [phone] [facility] office: [phone]

## 2024-05-04 ENCOUNTER — Other Ambulatory Visit: Payer: Self-pay

## 2024-05-04 ENCOUNTER — Encounter: Payer: Self-pay | Admitting: Allergy

## 2024-05-04 ENCOUNTER — Ambulatory Visit (INDEPENDENT_AMBULATORY_CARE_PROVIDER_SITE_OTHER): Payer: Self-pay | Admitting: Allergy

## 2024-05-04 VITALS — BP 124/88 | HR 65 | Temp 98.1°F | Resp 16 | Ht 65.35 in | Wt 212.0 lb

## 2024-05-04 DIAGNOSIS — R21 Rash and other nonspecific skin eruption: Secondary | ICD-10-CM | POA: Diagnosis not present

## 2024-05-04 DIAGNOSIS — J4530 Mild persistent asthma, uncomplicated: Secondary | ICD-10-CM

## 2024-05-04 DIAGNOSIS — J3089 Other allergic rhinitis: Secondary | ICD-10-CM | POA: Diagnosis not present

## 2024-05-04 DIAGNOSIS — Z888 Allergy status to other drugs, medicaments and biological substances status: Secondary | ICD-10-CM

## 2024-05-04 DIAGNOSIS — J45909 Unspecified asthma, uncomplicated: Secondary | ICD-10-CM | POA: Diagnosis not present

## 2024-05-04 DIAGNOSIS — J45998 Other asthma: Secondary | ICD-10-CM | POA: Diagnosis not present

## 2024-05-04 DIAGNOSIS — T7800XD Anaphylactic reaction due to unspecified food, subsequent encounter: Secondary | ICD-10-CM | POA: Diagnosis not present

## 2024-05-04 DIAGNOSIS — T50995D Adverse effect of other drugs, medicaments and biological substances, subsequent encounter: Secondary | ICD-10-CM

## 2024-05-04 MED ORDER — ALBUTEROL SULFATE HFA 108 (90 BASE) MCG/ACT IN AERS: 2.0000 | INHALATION_SPRAY | RESPIRATORY_TRACT | 1 refills | Status: AC | PRN

## 2024-05-04 MED ORDER — BUDESONIDE-FORMOTEROL FUMARATE 80-4.5 MCG/ACT IN AERO: 2.0000 | INHALATION_SPRAY | Freq: Two times a day (BID) | RESPIRATORY_TRACT | 3 refills | Status: DC

## 2024-05-04 NOTE — Patient Instructions (Addendum)
 Return for allergy skin testing. Will make additional recommendations based on results. Make sure you don't take any antihistamines for 3 days before the skin testing appointment. Don't put any lotion on the back and arms on the day of testing.  Plan on being here for 30-60 minutes.  Skin  Keep track of rashes and take pictures. Write down what you had done/eaten during flares.  See below for proper skin care. Use fragrance free and dye free products. No dryer sheets or fabric softener.    Food allergies Continue to avoid seafood. Plan on skin testing at next visit.  For mild symptoms you can take over the counter antihistamines (zyrtec 10mg  to 20mg ) and monitor symptoms closely.  If symptoms worsen or if you have severe symptoms including breathing issues, throat closure, significant swelling, whole body hives, severe diarrhea and vomiting, lightheadedness then use epinephrine  and seek immediate medical care afterwards. Emergency action plan given.  Breathing - most likely have asthma.  Daily controller medication(s): start Symbicort 80mcg 2 puffs twice a day with spacer and rinse mouth afterwards. Spacer given and demonstrated proper use with inhaler. Patient understood technique and all questions/concerned were addressed.  May use albuterol rescue inhaler 2 puffs every 4 to 6 hours as needed for shortness of breath, chest tightness, coughing, and wheezing. May use albuterol rescue inhaler 2 puffs 5 to 15 minutes prior to strenuous physical activities. Monitor frequency of use - if you need to use it more than twice per week on a consistent basis let us  know.  Breathing control goals:  Full participation in all desired activities (may need albuterol before activity) Albuterol use two times or less a week on average (not counting use with activity) Cough interfering with sleep two times or less a month Oral steroids no more than once a year No hospitalizations   Follow up for skin  testing in 1 months.    Skin care recommendations  Bath time: Always use lukewarm water. AVOID very hot or cold water. Keep bathing time to 5-10 minutes. Do NOT use bubble bath. Use a mild soap and use just enough to wash the dirty areas. Do NOT scrub skin vigorously.  After bathing, pat dry your skin with a towel. Do NOT rub or scrub the skin.  Moisturizers and prescriptions:  ALWAYS apply moisturizers immediately after bathing (within 3 minutes). This helps to lock-in moisture. Use the moisturizer several times a day over the whole body. Good summer moisturizers include: Aveeno, CeraVe, Cetaphil. Good winter moisturizers include: Aquaphor, Vaseline, Cerave, Cetaphil, Eucerin, Vanicream. When using moisturizers along with medications, the moisturizer should be applied about one hour after applying the medication to prevent diluting effect of the medication or moisturize around where you applied the medications. When not using medications, the moisturizer can be continued twice daily as maintenance.  Laundry and clothing: Avoid laundry products with added color or perfumes. Use unscented hypo-allergenic laundry products such as Tide free, Cheer free & gentle, and All free and clear.  If the skin still seems dry or sensitive, you can try double-rinsing the clothes. Avoid tight or scratchy clothing such as wool. Do not use fabric softeners or dyer sheets.

## 2024-06-01 ENCOUNTER — Ambulatory Visit: Admitting: Allergy

## 2024-06-01 NOTE — Progress Notes (Deleted)
 Skin testing note  RE: Karina [name] MRN: [id number] DOB: 1981/02/28 Date of Office Visit: 06/01/2024  Referring provider: Catalina Bare, MD Primary care provider: Catalina Bare, MD  Chief Complaint: skin testing  History of Present Illness: I had the pleasure of seeing Karina [name] for a skin testing visit at the [facility] on 06/01/2024. She is a 43 y.o. female, who is being followed for rash, allergic rhinitis, asthma, food allergies and adverse drug reactions. Her previous allergy office visit was on 05/04/2024 with Dr. Luke. Today is a skin testing visit.   Discussed the use of AI scribe software for clinical note transcription with the patient, who gave verbal consent to proceed.  History of Present Illness             *** Assessment and Plan: Karina [name] is a 43 y.o. female with: Rash and other nonspecific skin eruption Intermittent rashes with no known triggers x many years. Breakouts every 2 months lasting 1-2 weeks at a time.  Etiology unclear.  Keep track of rashes and take pictures. Write down what you had done/eaten during flares.  See below for proper skin care. Use fragrance free and dye free products. No dryer sheets or fabric softener.   Return for allergy testing (1-55, select foods) Consider patch testing as well.    Not well controlled mild persistent asthma - newly diagnosed. Episodes of dyspnea lasting 30 min. No prior asthma diagnosis or inhaler use. Today's spirometry showed mild obstructive disease with 15% improvement in FEV1 post bronchodilator treatment. Clinically feeling improved.  Consistent with asthma.  Daily controller medication(s): start Symbicort  80mcg 2 puffs twice a day with spacer and rinse mouth afterwards. Spacer given and demonstrated proper use with inhaler. Patient understood technique and all questions/concerned were addressed.  May use albuterol  rescue inhaler 2 puffs every 4 to 6 hours as needed for  shortness of breath, chest tightness, coughing, and wheezing. May use albuterol  rescue inhaler 2 puffs 5 to 15 minutes prior to strenuous physical activities. Monitor frequency of use - if you need to use it more than twice per week on a consistent basis let us  know.  Get spirometry at next visit.   Other allergic rhinitis Some rhinitis symptoms.  Return for allergy skin testing. Will make additional recommendations based on results.   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 EpiPen  prescribed, no prior workup. Continue to avoid seafood. Plan on skin testing at next visit. For mild symptoms you can take over the counter antihistamines (zyrtec 10mg  to 20mg ) and monitor symptoms closely.  If symptoms worsen or if you have severe symptoms including breathing issues, throat closure, significant swelling, whole body hives, severe diarrhea and vomiting, lightheadedness then use epinephrine  and seek immediate medical care afterwards. Emergency action plan given.   Adverse effect of other drugs, medicaments and biological substances, subsequent encounter Continue to avoid aspirin and latex.  Assessment and Plan              No follow-ups on file.  No orders of the defined types were placed in this encounter.  Lab Orders  No laboratory test(s) ordered today    Diagnostics: Skin Testing: Environmental allergy panel and select foods. *** Results discussed with patient/family.   Previous notes and tests were reviewed. The plan was reviewed with the patient/family, and all questions/concerned were addressed.  It was my pleasure to see Karina [name] today  and participate in her care. Please feel free to contact me with any questions or concerns.  Sincerely,  Orlan Cramp, DO Allergy & Immunology  [facility]  [facility] office:  [phone] [facility] office: [phone]

## 2024-07-26 ENCOUNTER — Other Ambulatory Visit: Payer: Self-pay | Admitting: Allergy

## 2024-08-04 DIAGNOSIS — Z0001 Encounter for general adult medical examination with abnormal findings: Secondary | ICD-10-CM | POA: Diagnosis not present

## 2024-08-04 DIAGNOSIS — R7303 Prediabetes: Secondary | ICD-10-CM | POA: Diagnosis not present

## 2024-08-04 DIAGNOSIS — N946 Dysmenorrhea, unspecified: Secondary | ICD-10-CM | POA: Diagnosis not present

## 2024-08-04 DIAGNOSIS — E782 Mixed hyperlipidemia: Secondary | ICD-10-CM | POA: Diagnosis not present

## 2024-08-04 DIAGNOSIS — N921 Excessive and frequent menstruation with irregular cycle: Secondary | ICD-10-CM | POA: Diagnosis not present

## 2024-08-04 DIAGNOSIS — I10 Essential (primary) hypertension: Secondary | ICD-10-CM | POA: Diagnosis not present

## 2024-08-04 DIAGNOSIS — Z131 Encounter for screening for diabetes mellitus: Secondary | ICD-10-CM | POA: Diagnosis not present

## 2024-08-20 ENCOUNTER — Encounter: Payer: Self-pay | Admitting: Neurology

## 2024-12-16 ENCOUNTER — Ambulatory Visit: Admitting: Neurology

## 2024-12-16 ENCOUNTER — Encounter: Payer: Self-pay | Admitting: Neurology

## 2024-12-16 VITALS — BP 141/92 | HR 66 | Ht 66.0 in | Wt 201.0 lb

## 2024-12-16 DIAGNOSIS — G43109 Migraine with aura, not intractable, without status migrainosus: Secondary | ICD-10-CM

## 2024-12-16 DIAGNOSIS — R200 Anesthesia of skin: Secondary | ICD-10-CM

## 2024-12-16 MED ORDER — SUMATRIPTAN SUCCINATE 100 MG PO TABS: ORAL_TABLET | ORAL | 5 refills | Status: AC

## 2024-12-16 MED ORDER — ONDANSETRON 4 MG PO TBDP: 4.0000 mg | ORAL_TABLET | Freq: Three times a day (TID) | ORAL | 5 refills | Status: AC | PRN

## 2024-12-16 NOTE — Progress Notes (Signed)
 "  NEUROLOGY CONSULTATION NOTE  Dearra Myhand MRN: [id number] DOB: [birth year]  Referring provider: Zachary Conger, MD Primary care provider: Zachary Conger, MD  Reason for consult:  headache  Assessment/Plan:   Migraine with aura, without status migrainosus, not intractable - improvement over the past 6 weeks following adjustment of her antihypertensive medications. Left sided numbness (face, arm and leg) - unclear etiology. I have no explanation for her reported left upper extremity involuntary movement.  Unclear if it is a tic.     As I have no explanation for her left sided numbness, will check MRI of brain with and without contrast Migraine prevention:  Will monitor to see if headaches continue to improve following change in her antihypertensive medications.  If no continued improvement in 2 months, she was advised to contact me and we can start a preventative, probably propranolol Migraine rescue:  She will try sumatriptan  100mg ; Zofran -ODT 4mg  for nausea Lifestyle modification: Limit use of pain relievers to no more than 9 days out of the month to prevent risk of rebound or medication-overuse headache. Diet modification/hydration/caffeine cessation Routine exercise Sleep hygiene Consider vitamins/supplements:  magnesium citrate 400mg  daily, riboflavin 400mg  daily, CoQ10 100mg  three times daily Keep headache diary Follow up 6 months    Subjective:  Karina [name] is a 44 year old female with HTN, Bipolar 2 disorder and depression who presents for headache.  History supplemented by referring provider's note.  Onset:  Following viral meningitis at 44 years old.   Location:  starts over right eye an radiates around the back of her head and radiating in band-like distribution Quality:  sometimes pressure, sometimes sharp,burning sensation Intensity:  10/10.  Aura: splotches in vision Prodrome:  absent Associated symptoms:  Nausea, dizzy/lightheadedness, photophobia,  osmophobia, shooting pain throughout body (usually left side frequently left side of chest and left lower back), blurred/jumping vision.  She denies associated phonophobia, unilateral numbness or weakness. Duration:  3 to 4 hours Frequency:  4 in the past 6 weeks; previously they were daily but improved after change in blood pressure medication. Usually wakes up with migraine.   Triggers:  unknown Relieving factors:  rest Activity:  aggravates  Since she was a child, she reports that her left arm will involuntarily make a throwing motion.  If she is holding an object, she will throw it without control.  Does not know what triggers it.  Occurs when not holding something as well.  It doesn't occur with her right hand.    CT head on 05/12/2014 was normal  Past NSAIDS/analgesics:  BC powder, ibuprofen , Tylenol  Past abortive triptans:  none Past abortive ergotamine:  absent Past muscle relaxants:  tizanidine Past anti-emetic:  promethazine , ondansetron  Past antihypertensive medications:  misoprostol , nifedipine Past antidepressant/antipsychotic medications:  venlafaxine, quetiapine Past anticonvulsant medications:  none Past anti-CGRP:  none Past vitamins/Herbal/Supplements: none Past antihistamines/decongestants:  none Other past therapies:  none  Current NSAIDS/analgesics:  Excedrin Migraine Current triptans:  none Current ergotamine:  none Current anti-emetic:  none Current muscle relaxants:  none Current Antihypertensive medications:  amlodipine 5mg  Current Antidepressant/antipsychotic medications:  none Current Anticonvulsant medications:  gabapentin 300mg  PRN (menstrual pain) Current anti-CGRP:  none Current Vitamins/Herbal/Supplements:   One-A Day, B12 complex Current Antihistamines/Decongestants:  Sudafed, Benadryl  Other therapy:  none Birth control:  none   Caffeine:  Drinks 12 cups of coffee daily.  No soda or energy drinks Alcohol:  no Diet:  64 oz water daily.  Fruits,  vegetables during day, at night will have a full  meal (steak, pasta, etc) Exercise:  started tai chi, yoga, dance exercise, strength exercise, rowing Depression:  some; Anxiety:  poor Sleep hygiene:  Poor.  3 or 4 hours a night.  Falls asleep fast but wakes up and cannot fall back asleep   History of TBI/concussion:  no Family history of migraines:  mother, paternal aunt, maternal aunt, grandmother, siblings Family history of cerebral aneurysm:  no      PAST MEDICAL HISTORY: Past Medical History:  Diagnosis Date   Abnormal Pap smear    Chlamydia    Depression    Headache(784.0)    Hypertension    Infection    IUFD (intrauterine fetal death) 09/26/2004   Term IUFD at 40 weeks, unknown cause    Pregnancy induced hypertension    Preterm labor    Urticaria    UTI (lower urinary tract infection)    Viral meningitis     PAST SURGICAL HISTORY: Past Surgical History:  Procedure Laterality Date   elective abortion      MEDICATIONS: Medications Ordered Prior to Encounter[1]  ALLERGIES: Allergies[2]  FAMILY HISTORY: Family History  Problem Relation Age of Onset   Cancer Mother    Diabetes Mother    Hyperlipidemia Mother    Depression Mother    Hypertension Mother    Mental illness Mother    Arthritis Maternal Grandmother    Diabetes Maternal Grandmother    Stroke Maternal Grandmother    Diabetes Maternal Grandfather    Hypertension Maternal Grandfather     Objective:  Blood pressure (!) 141/92, pulse 66, height 5' 6 (1.676 m), weight 201 lb (91.2 kg), SpO2 100%. General: No acute distress.  Patient appears well-groomed.   Head:  Normocephalic/atraumatic Eyes:  fundi examined but not visualized Neck: supple, no paraspinal tenderness, full range of motion Heart: regular rate and rhythm Neurological Exam: Mental status: alert and oriented to person, place, and time, speech fluent and not dysarthric, language intact. Cranial nerves: CN I: not tested CN II:  pupils equal, round and reactive to light, visual fields intact CN III, IV, VI:  full range of motion, no nystagmus, no ptosis CN V: facial sensation reduced in left V1-V3 CN VII: upper and lower face symmetric CN VIII: hearing intact CN IX, X: gag intact, uvula midline CN XI: sternocleidomastoid and trapezius muscles intact CN XII: tongue midline Bulk & Tone: normal, no fasciculations. Motor:  muscle strength 5/5 throughout Sensation:  Pinprick and vibratory sensation reduced in left upper and lower extremities. Deep Tendon Reflexes:  2+ throughout,  toes downgoing.   Finger to nose testing:  Without dysmetria.   Gait:  Normal station and stride.  Romberg negative.    Juliene Dunnings, DO  CC: Zachary Conger, MD        [1]  Current Outpatient Medications on File Prior to Visit  Medication Sig Dispense Refill   albuterol  (VENTOLIN  HFA) 108 (90 Base) MCG/ACT inhaler Inhale 2 puffs into the lungs every 4 (four) hours as needed for wheezing or shortness of breath (coughing fits). 18 g 1   budesonide -formoterol  (SYMBICORT ) 80-4.5 MCG/ACT inhaler INHALE 2 PUFFS INTO THE LUNGS IN THE MORNING AND AT BEDTIME. WITH SPACER AND RINSE MOUTH AFTERWARDS. 30.6 each 3   EPINEPHrine  0.3 mg/0.3 mL IJ SOAJ injection Inject 0.3 mg into the muscle as needed for anaphylaxis.     gabapentin (NEURONTIN) 300 MG capsule Take 300 mg by mouth as needed.     NIFEdipine (PROCARDIA-XL/NIFEDICAL-XL) 30 MG 24 hr tablet Take 30 mg  by mouth daily.     No current facility-administered medications on file prior to visit.  [2]  Allergies Allergen Reactions   Shellfish Allergy Anaphylaxis   Aspirin Other (See Comments)   Latex Itching and Rash   "

## 2024-12-16 NOTE — Patient Instructions (Signed)
" °  Check MRI of brain with and without contrast If headaches do not continue to improve in 2 months, send me a Mychart message and we can start a preventative. Take SUMATRIPTAN  100MG  at earliest onset of headache.  May repeat dose once in 2 hours if needed.  Maximum 2 tablets in 24 hours. Take ONDANSETRON  for nausea Limit use of pain relievers to no more than 9 days out of the month.  These medications include acetaminophen , NSAIDs (ibuprofen /Advil /Motrin , naproxen/Aleve, triptans (Imitrex /sumatriptan ), Excedrin, and narcotics.  This will help reduce risk of rebound headaches. Be aware of common food triggers Routine exercise Stay adequately hydrated (aim for 64 oz water daily) Keep headache diary Maintain proper stress management Maintain proper sleep hygiene Do not skip meals Consider supplements:  magnesium citrate 400mg  daily, riboflavin 400mg  daily, coenzyme Q10 100mg  three times daily OR MigreLief combination pill twice daily.  "

## 2024-12-17 ENCOUNTER — Ambulatory Visit: Admitting: Neurology

## 2025-01-05 ENCOUNTER — Other Ambulatory Visit

## 2025-07-02 ENCOUNTER — Ambulatory Visit: Payer: Self-pay | Admitting: Neurology
